# Patient Record
Sex: Female | Born: 1940 | Race: White | Hispanic: No | Marital: Married | Smoking: Never smoker
Health system: Southern US, Community
[De-identification: ages and names within clinical notes are randomized; demographics above are authoritative.]

## PROBLEM LIST (undated history)

## (undated) ENCOUNTER — Encounter: Attending: Diagnostic Radiology | Primary: Diagnostic Radiology

## (undated) ENCOUNTER — Encounter

## (undated) ENCOUNTER — Telehealth

## (undated) ENCOUNTER — Ambulatory Visit: Payer: MEDICARE

## (undated) ENCOUNTER — Telehealth: Attending: Hematology & Oncology | Primary: Hematology & Oncology

## (undated) ENCOUNTER — Encounter: Attending: Hematology & Oncology | Primary: Hematology & Oncology

## (undated) ENCOUNTER — Ambulatory Visit: Payer: MEDICARE | Attending: Hematology & Oncology | Primary: Hematology & Oncology

## (undated) ENCOUNTER — Ambulatory Visit: Payer: BLUE CROSS/BLUE SHIELD

## (undated) ENCOUNTER — Ambulatory Visit

## (undated) ENCOUNTER — Ambulatory Visit: Payer: BLUE CROSS/BLUE SHIELD | Attending: Hematology & Oncology | Primary: Hematology & Oncology

## (undated) DIAGNOSIS — R350 Frequency of micturition: Secondary | ICD-10-CM

## (undated) DIAGNOSIS — I10 Essential (primary) hypertension: Secondary | ICD-10-CM

## (undated) DIAGNOSIS — C50911 Malignant neoplasm of unspecified site of right female breast: Secondary | ICD-10-CM

## (undated) DIAGNOSIS — R011 Cardiac murmur, unspecified: Secondary | ICD-10-CM

## (undated) DIAGNOSIS — M109 Gout, unspecified: Secondary | ICD-10-CM

## (undated) DIAGNOSIS — R0600 Dyspnea, unspecified: Secondary | ICD-10-CM

## (undated) DIAGNOSIS — C50912 Malignant neoplasm of unspecified site of left female breast: Secondary | ICD-10-CM

## (undated) DIAGNOSIS — K219 Gastro-esophageal reflux disease without esophagitis: Secondary | ICD-10-CM

## (undated) DIAGNOSIS — M1990 Unspecified osteoarthritis, unspecified site: Secondary | ICD-10-CM

## (undated) HISTORY — PX: DILATION AND CURETTAGE OF UTERUS: SHX78

## (undated) HISTORY — PX: VAGINAL HYSTERECTOMY: SUR661

---

## 1981-02-16 HISTORY — PX: CYST EXCISION: SHX5701

## 1989-02-16 DIAGNOSIS — C50912 Malignant neoplasm of unspecified site of left female breast: Secondary | ICD-10-CM

## 1989-02-16 HISTORY — PX: BREAST BIOPSY: SHX20

## 1989-02-16 HISTORY — PX: MASTECTOMY: SHX3

## 1989-02-16 HISTORY — DX: Malignant neoplasm of unspecified site of left female breast: C50.912

## 2008-02-17 DIAGNOSIS — C50911 Malignant neoplasm of unspecified site of right female breast: Secondary | ICD-10-CM

## 2008-02-17 HISTORY — PX: BREAST BIOPSY: SHX20

## 2008-02-17 HISTORY — PX: MASTECTOMY: SHX3

## 2008-02-17 HISTORY — DX: Malignant neoplasm of unspecified site of right female breast: C50.911

## 2017-06-03 ENCOUNTER — Ambulatory Visit: Admit: 2017-06-03 | Discharge: 2017-06-03 | Payer: MEDICARE

## 2017-06-03 ENCOUNTER — Encounter: Admit: 2017-06-03 | Discharge: 2017-06-03 | Payer: MEDICARE

## 2017-06-03 DIAGNOSIS — C50211 Malignant neoplasm of upper-inner quadrant of right female breast: Principal | ICD-10-CM

## 2017-11-11 NOTE — Pre-Procedure Instructions (Signed)
Sandra Wise  11/11/2017      [facility] - [place], [place] - [place] [address] [place] [address] Phone: [phone] Fax: [phone]    Your procedure is scheduled on Thursday,September 3.  Report to [facility] Admitting at 8:00 AM               Your surgery or procedure is scheduled for 10:00 A.M.   Call this number if you have problems the morning of surgery: [phone]  This is the number for the Pre- Surgical Desk.     Remember:  Do not eat or drink after midnight Wednesday, October 2.                     Take these medicines the morning of surgery with A SIP OF WATER: allopurinol (ZYLOPRIM)  If needed may take:  Eye Drops acetaminophen (TYLENOL 8 HOUR ARTHRITIS PAIN)  STOP taking Aspirin, Aspirin Products (Goody Powder, Excedrin Migraine), Ibuprofen (Advil), Naproxen (Aleve),diclofenac sodium (VOLTAREN)  Vitamins and Herbal Products (ie Fish Oil)    Do not wear jewelry, make-up or nail polish.  Do not wear lotions, powders, or perfumes, or deodorant.  Do not shave 48 hours prior to surgery.  Men may shave face and neck.  Do not bring valuables to the hospital.  [facility] is not responsible for any belongings or valuables.  Contacts, dentures or bridgework may not be worn into surgery.  Leave your suitcase in the car.  After surgery it may be brought to your room.  For patients admitted to the hospital, discharge time will be determined by your treatment team.  Patients discharged the day of surgery will not be allowed to drive home.   Special instructions:   [facility]- Preparing For Surgery  Before surgery, you can play an important role. Because skin is not sterile, your skin needs to be as free of germs as possible. You can reduce the number of germs on your skin by washing with CHG (chlorahexidine gluconate) Soap before surgery.  CHG is an antiseptic cleaner which kills germs and bonds with the skin to continue killing  germs even after washing.    Oral Hygiene is also important to reduce your risk of infection.  Remember - BRUSH YOUR TEETH THE MORNING OF SURGERY WITH YOUR REGULAR TOOTHPASTE  Please do not use if you have an allergy to CHG or antibacterial soaps. If your skin becomes reddened/irritated stop using the CHG.  Do not shave (including legs and underarms) for at least 48 hours prior to first CHG shower. It is OK to shave your face.  Please follow these instructions carefully.   1. Shower the NIGHT BEFORE SURGERY and the MORNING OF SURGERY with CHG.   2. If you chose to wash your hair, wash your hair first as usual with your normal shampoo.  3. After you shampoo, rinse your hair and body thoroughly to remove the shampoo.  4. Use CHG as you would any other liquid soap. You can apply CHG directly to the skin and wash gently with a scrungie or a clean washcloth.   5. Apply the CHG Soap to your body ONLY FROM THE NECK DOWN.  Do not use on open wounds or open sores. Avoid contact with your eyes, ears, mouth and genitals (private parts). Wash Face and genitals (private parts)  with your normal soap.  6. Wash thoroughly, paying special attention to the area where your  surgery will be performed.  7. Thoroughly rinse your body with warm water from the neck down.  8. DO NOT shower/wash with your normal soap after using and rinsing off the CHG Soap.  9. Pat yourself dry with a CLEAN TOWEL.  10. Wear CLEAN PAJAMAS to bed the night before surgery, wear comfortable clothes the morning of surgery  11. Place CLEAN SHEETS on your bed the night of your first shower and DO NOT SLEEP WITH PETS.    Day of Surgery:  Do not apply any deodorants/lotions.  Please wear clean clothes to the hospital/surgery center.   Remember to brush your teeth WITH YOUR REGULAR TOOTHPASTE.  Please read over the following fact sheets that you were given.

## 2017-11-12 ENCOUNTER — Other Ambulatory Visit: Payer: Self-pay

## 2017-11-12 ENCOUNTER — Encounter (HOSPITAL_COMMUNITY): Payer: Self-pay | Admitting: *Deleted

## 2017-11-12 ENCOUNTER — Encounter (HOSPITAL_COMMUNITY)
Admission: RE | Admit: 2017-11-12 | Discharge: 2017-11-12 | Disposition: A | Discharge status: Home / Self Care | Payer: Medicare Other | Source: Ambulatory Visit | Attending: Orthopedic Surgery | Admitting: Orthopedic Surgery

## 2017-11-12 DIAGNOSIS — Z01818 Encounter for other preprocedural examination: Secondary | ICD-10-CM | POA: Insufficient documentation

## 2017-11-12 HISTORY — DX: Frequency of micturition: R35.0

## 2017-11-12 HISTORY — DX: Essential (primary) hypertension: I10

## 2017-11-12 HISTORY — DX: Dyspnea, unspecified: R06.00

## 2017-11-12 HISTORY — DX: Unspecified osteoarthritis, unspecified site: M19.90

## 2017-11-12 LAB — CBC
HEMATOCRIT: 45 % (ref 36.0–46.0)
HEMOGLOBIN: 13.7 g/dL (ref 12.0–15.0)
MCH: 32.1 pg (ref 26.0–34.0)
MCHC: 30.4 g/dL (ref 30.0–36.0)
MCV: 105.4 fL — ABNORMAL HIGH (ref 78.0–100.0)
Platelets: 181 10*3/uL (ref 150–400)
RBC: 4.27 MIL/uL (ref 3.87–5.11)
RDW: 14 % (ref 11.5–15.5)
WBC: 7.1 10*3/uL (ref 4.0–10.5)

## 2017-11-12 LAB — BASIC METABOLIC PANEL
Anion gap: 15 (ref 5–15)
BUN: 31 mg/dL — AB (ref 8–23)
CHLORIDE: 104 mmol/L (ref 98–111)
CO2: 19 mmol/L — ABNORMAL LOW (ref 22–32)
Calcium: 10 mg/dL (ref 8.9–10.3)
Creatinine, Ser: 1.07 mg/dL — ABNORMAL HIGH (ref 0.44–1.00)
GFR calc Af Amer: 56 mL/min — ABNORMAL LOW (ref >60)
GFR calc non Af Amer: 48 mL/min — ABNORMAL LOW (ref >60)
GLUCOSE: 91 mg/dL (ref 70–99)
POTASSIUM: 4.2 mmol/L (ref 3.5–5.1)
SODIUM: 138 mmol/L (ref 135–145)

## 2017-11-12 LAB — SURGICAL PCR SCREEN
MRSA, PCR: NEGATIVE
Staphylococcus aureus: NEGATIVE

## 2017-11-12 NOTE — Progress Notes (Signed)
PCP Ardelle Balls  [place] ,[place].

## 2017-11-17 MED ORDER — TRANEXAMIC ACID 1000 MG/10ML IV SOLN
1000.0000 mg | INTRAVENOUS | Status: AC
  Administered 2017-11-18: 1000 mg via INTRAVENOUS
  Filled 2017-11-17: qty 1000

## 2017-11-18 ENCOUNTER — Encounter (HOSPITAL_COMMUNITY)
Admission: RE | Disposition: A | Discharge status: Home / Self Care | Payer: Self-pay | Source: Home / Self Care | Attending: Orthopedic Surgery

## 2017-11-18 ENCOUNTER — Other Ambulatory Visit: Payer: Self-pay

## 2017-11-18 ENCOUNTER — Inpatient Hospital Stay (HOSPITAL_COMMUNITY)
Admission: RE | Admit: 2017-11-18 | Discharge: 2017-11-19 | DRG: 483 | Disposition: A | Discharge status: Home / Self Care | Payer: Medicare Other | Attending: Orthopedic Surgery | Admitting: Orthopedic Surgery

## 2017-11-18 ENCOUNTER — Encounter (HOSPITAL_COMMUNITY): Payer: Self-pay

## 2017-11-18 ENCOUNTER — Inpatient Hospital Stay (HOSPITAL_COMMUNITY): Payer: Medicare Other | Admitting: Anesthesiology

## 2017-11-18 DIAGNOSIS — I129 Hypertensive chronic kidney disease with stage 1 through stage 4 chronic kidney disease, or unspecified chronic kidney disease: Secondary | ICD-10-CM | POA: Diagnosis present

## 2017-11-18 DIAGNOSIS — K219 Gastro-esophageal reflux disease without esophagitis: Secondary | ICD-10-CM | POA: Diagnosis present

## 2017-11-18 DIAGNOSIS — Z888 Allergy status to other drugs, medicaments and biological substances status: Secondary | ICD-10-CM | POA: Diagnosis not present

## 2017-11-18 DIAGNOSIS — M19012 Primary osteoarthritis, left shoulder: Principal | ICD-10-CM | POA: Diagnosis present

## 2017-11-18 DIAGNOSIS — Z90710 Acquired absence of both cervix and uterus: Secondary | ICD-10-CM | POA: Diagnosis not present

## 2017-11-18 DIAGNOSIS — M109 Gout, unspecified: Secondary | ICD-10-CM | POA: Diagnosis present

## 2017-11-18 DIAGNOSIS — M75102 Unspecified rotator cuff tear or rupture of left shoulder, not specified as traumatic: Secondary | ICD-10-CM | POA: Diagnosis present

## 2017-11-18 DIAGNOSIS — Z96619 Presence of unspecified artificial shoulder joint: Secondary | ICD-10-CM

## 2017-11-18 DIAGNOSIS — Z853 Personal history of malignant neoplasm of breast: Secondary | ICD-10-CM | POA: Diagnosis not present

## 2017-11-18 DIAGNOSIS — Z79899 Other long term (current) drug therapy: Secondary | ICD-10-CM

## 2017-11-18 DIAGNOSIS — N189 Chronic kidney disease, unspecified: Secondary | ICD-10-CM | POA: Diagnosis present

## 2017-11-18 HISTORY — DX: Gastro-esophageal reflux disease without esophagitis: K21.9

## 2017-11-18 HISTORY — PX: REVERSE SHOULDER ARTHROPLASTY: SHX5054

## 2017-11-18 HISTORY — DX: Gout, unspecified: M10.9

## 2017-11-18 HISTORY — DX: Malignant neoplasm of unspecified site of right female breast: C50.911

## 2017-11-18 HISTORY — DX: Malignant neoplasm of unspecified site of left female breast: C50.912

## 2017-11-18 SURGERY — ARTHROPLASTY, SHOULDER, TOTAL, REVERSE
Anesthesia: General | Site: Shoulder | Laterality: Left

## 2017-11-18 MED ORDER — ONDANSETRON HCL 4 MG/2ML IJ SOLN: 4.0000 mg | Freq: Four times a day (QID) | INTRAMUSCULAR | Status: DC | PRN

## 2017-11-18 MED ORDER — GLYCOPYRROLATE PF 0.2 MG/ML IJ SOSY
PREFILLED_SYRINGE | INTRAMUSCULAR | Status: AC
  Filled 2017-11-18: qty 1

## 2017-11-18 MED ORDER — PROPOFOL 10 MG/ML IV BOLUS
INTRAVENOUS | Status: DC | PRN
  Administered 2017-11-18: 150 mg via INTRAVENOUS

## 2017-11-18 MED ORDER — EPHEDRINE 5 MG/ML INJ
INTRAVENOUS | Status: AC
  Filled 2017-11-18: qty 10

## 2017-11-18 MED ORDER — PHENOL 1.4 % MT LIQD: 1.0000 | OROMUCOSAL | Status: DC | PRN

## 2017-11-18 MED ORDER — MEPERIDINE HCL 50 MG/ML IJ SOLN: 6.2500 mg | INTRAMUSCULAR | Status: DC | PRN

## 2017-11-18 MED ORDER — ALLOPURINOL 100 MG PO TABS
100.0000 mg | ORAL_TABLET | Freq: Every day | ORAL | Status: DC
  Administered 2017-11-19: 100 mg via ORAL
  Filled 2017-11-18: qty 1

## 2017-11-18 MED ORDER — MAGNESIUM CITRATE PO SOLN: 1.0000 | Freq: Once | ORAL | Status: DC | PRN

## 2017-11-18 MED ORDER — PHENYLEPHRINE 40 MCG/ML (10ML) SYRINGE FOR IV PUSH (FOR BLOOD PRESSURE SUPPORT)
PREFILLED_SYRINGE | INTRAVENOUS | Status: DC | PRN
  Administered 2017-11-18: 80 ug via INTRAVENOUS

## 2017-11-18 MED ORDER — OXYCODONE HCL 5 MG PO TABS: 5.0000 mg | ORAL_TABLET | ORAL | Status: DC | PRN

## 2017-11-18 MED ORDER — MENTHOL 3 MG MT LOZG
1.0000 | LOZENGE | OROMUCOSAL | Status: DC | PRN
  Administered 2017-11-18: 3 mg via ORAL
  Filled 2017-11-18 (×2): qty 9

## 2017-11-18 MED ORDER — FENTANYL CITRATE (PF) 100 MCG/2ML IJ SOLN
50.0000 ug | Freq: Once | INTRAMUSCULAR | Status: AC
  Administered 2017-11-18: 50 ug via INTRAVENOUS

## 2017-11-18 MED ORDER — ACETAMINOPHEN 325 MG PO TABS
325.0000 mg | ORAL_TABLET | Freq: Four times a day (QID) | ORAL | Status: DC | PRN
  Administered 2017-11-19 (×2): 650 mg via ORAL
  Filled 2017-11-18 (×2): qty 2

## 2017-11-18 MED ORDER — FENTANYL CITRATE (PF) 100 MCG/2ML IJ SOLN
INTRAMUSCULAR | Status: AC
  Administered 2017-11-18: 50 ug via INTRAVENOUS
  Filled 2017-11-18: qty 2

## 2017-11-18 MED ORDER — FENTANYL CITRATE (PF) 250 MCG/5ML IJ SOLN
INTRAMUSCULAR | Status: AC
  Filled 2017-11-18: qty 5

## 2017-11-18 MED ORDER — PHENYLEPHRINE 40 MCG/ML (10ML) SYRINGE FOR IV PUSH (FOR BLOOD PRESSURE SUPPORT)
PREFILLED_SYRINGE | INTRAVENOUS | Status: AC
  Filled 2017-11-18: qty 10

## 2017-11-18 MED ORDER — CEFAZOLIN SODIUM-DEXTROSE 2-4 GM/100ML-% IV SOLN
2.0000 g | INTRAVENOUS | Status: AC
  Administered 2017-11-18: 2 g via INTRAVENOUS

## 2017-11-18 MED ORDER — METOCLOPRAMIDE HCL 5 MG/ML IJ SOLN: 5.0000 mg | Freq: Three times a day (TID) | INTRAMUSCULAR | Status: DC | PRN

## 2017-11-18 MED ORDER — HYDROMORPHONE HCL 1 MG/ML IJ SOLN: 0.5000 mg | INTRAMUSCULAR | Status: DC | PRN

## 2017-11-18 MED ORDER — LACTATED RINGERS IV SOLN
INTRAVENOUS | Status: DC
  Administered 2017-11-18: 09:00:00 via INTRAVENOUS

## 2017-11-18 MED ORDER — LATANOPROST 0.005 % OP SOLN
1.0000 [drp] | Freq: Every day | OPHTHALMIC | Status: DC
  Administered 2017-11-18: 1 [drp] via OPHTHALMIC
  Filled 2017-11-18: qty 2.5

## 2017-11-18 MED ORDER — ROCURONIUM BROMIDE 50 MG/5ML IV SOSY
PREFILLED_SYRINGE | INTRAVENOUS | Status: AC
  Filled 2017-11-18: qty 5

## 2017-11-18 MED ORDER — BUPIVACAINE LIPOSOME 1.3 % IJ SUSP
INTRAMUSCULAR | Status: DC | PRN
  Administered 2017-11-18 (×2): 3 mL via PERINEURAL
  Administered 2017-11-18: 4 mL via PERINEURAL

## 2017-11-18 MED ORDER — METHOCARBAMOL 1000 MG/10ML IJ SOLN
500.0000 mg | Freq: Four times a day (QID) | INTRAVENOUS | Status: DC | PRN
  Filled 2017-11-18: qty 5

## 2017-11-18 MED ORDER — MIDAZOLAM HCL 2 MG/2ML IJ SOLN
INTRAMUSCULAR | Status: AC
  Administered 2017-11-18: 1 mg via INTRAVENOUS
  Filled 2017-11-18: qty 2

## 2017-11-18 MED ORDER — POLYETHYLENE GLYCOL 3350 17 G PO PACK: 17.0000 g | PACK | Freq: Every day | ORAL | Status: DC | PRN

## 2017-11-18 MED ORDER — DIPHENHYDRAMINE HCL 12.5 MG/5ML PO ELIX: 12.5000 mg | ORAL_SOLUTION | ORAL | Status: DC | PRN

## 2017-11-18 MED ORDER — LACTATED RINGERS IV SOLN
INTRAVENOUS | Status: DC
  Administered 2017-11-18: 16:00:00 via INTRAVENOUS

## 2017-11-18 MED ORDER — KETOROLAC TROMETHAMINE 15 MG/ML IJ SOLN: 15.0000 mg | Freq: Once | INTRAMUSCULAR | Status: DC

## 2017-11-18 MED ORDER — LIDOCAINE 2% (20 MG/ML) 5 ML SYRINGE
INTRAMUSCULAR | Status: DC | PRN
  Administered 2017-11-18: 100 mg via INTRAVENOUS

## 2017-11-18 MED ORDER — DEXAMETHASONE SODIUM PHOSPHATE 10 MG/ML IJ SOLN
INTRAMUSCULAR | Status: AC
  Filled 2017-11-18: qty 1

## 2017-11-18 MED ORDER — ROCURONIUM BROMIDE 10 MG/ML (PF) SYRINGE
PREFILLED_SYRINGE | INTRAVENOUS | Status: DC | PRN
  Administered 2017-11-18: 50 mg via INTRAVENOUS
  Administered 2017-11-18: 10 mg via INTRAVENOUS

## 2017-11-18 MED ORDER — MIDAZOLAM HCL 2 MG/2ML IJ SOLN
1.0000 mg | Freq: Once | INTRAMUSCULAR | Status: AC
  Administered 2017-11-18: 1 mg via INTRAVENOUS

## 2017-11-18 MED ORDER — FENTANYL CITRATE (PF) 250 MCG/5ML IJ SOLN
INTRAMUSCULAR | Status: DC | PRN
  Administered 2017-11-18: 50 ug via INTRAVENOUS

## 2017-11-18 MED ORDER — METOCLOPRAMIDE HCL 5 MG PO TABS: 5.0000 mg | ORAL_TABLET | Freq: Three times a day (TID) | ORAL | Status: DC | PRN

## 2017-11-18 MED ORDER — ALUM & MAG HYDROXIDE-SIMETH 200-200-20 MG/5ML PO SUSP: 30.0000 mL | ORAL | Status: DC | PRN

## 2017-11-18 MED ORDER — PROMETHAZINE HCL 25 MG/ML IJ SOLN: 6.2500 mg | INTRAMUSCULAR | Status: DC | PRN

## 2017-11-18 MED ORDER — TEMAZEPAM 15 MG PO CAPS: 15.0000 mg | ORAL_CAPSULE | Freq: Every evening | ORAL | Status: DC | PRN

## 2017-11-18 MED ORDER — ONDANSETRON HCL 4 MG/2ML IJ SOLN
INTRAMUSCULAR | Status: AC
  Filled 2017-11-18: qty 2

## 2017-11-18 MED ORDER — CHLORHEXIDINE GLUCONATE 4 % EX LIQD: 60.0000 mL | Freq: Once | CUTANEOUS | Status: DC

## 2017-11-18 MED ORDER — HYDROMORPHONE HCL 1 MG/ML IJ SOLN: 0.2500 mg | INTRAMUSCULAR | Status: DC | PRN

## 2017-11-18 MED ORDER — ONDANSETRON HCL 4 MG/2ML IJ SOLN
INTRAMUSCULAR | Status: DC | PRN
  Administered 2017-11-18: 4 mg via INTRAVENOUS

## 2017-11-18 MED ORDER — 0.9 % SODIUM CHLORIDE (POUR BTL) OPTIME
TOPICAL | Status: DC | PRN
  Administered 2017-11-18: 1000 mL

## 2017-11-18 MED ORDER — METHOCARBAMOL 500 MG PO TABS: 500.0000 mg | ORAL_TABLET | Freq: Four times a day (QID) | ORAL | Status: DC | PRN

## 2017-11-18 MED ORDER — ONDANSETRON HCL 4 MG PO TABS: 4.0000 mg | ORAL_TABLET | Freq: Four times a day (QID) | ORAL | Status: DC | PRN

## 2017-11-18 MED ORDER — DOCUSATE SODIUM 100 MG PO CAPS
100.0000 mg | ORAL_CAPSULE | Freq: Two times a day (BID) | ORAL | Status: DC
  Administered 2017-11-18 – 2017-11-19 (×2): 100 mg via ORAL
  Filled 2017-11-18 (×2): qty 1

## 2017-11-18 MED ORDER — SODIUM CHLORIDE 0.9 % IV SOLN
INTRAVENOUS | Status: DC | PRN
  Administered 2017-11-18: 10 ug/min via INTRAVENOUS

## 2017-11-18 MED ORDER — SUGAMMADEX SODIUM 200 MG/2ML IV SOLN
INTRAVENOUS | Status: DC | PRN
  Administered 2017-11-18: 200 mg via INTRAVENOUS

## 2017-11-18 MED ORDER — DOXEPIN HCL 10 MG PO CAPS
10.0000 mg | ORAL_CAPSULE | Freq: Every day | ORAL | Status: DC
  Administered 2017-11-18: 10 mg via ORAL
  Filled 2017-11-18: qty 1

## 2017-11-18 MED ORDER — LISINOPRIL 20 MG PO TABS
20.0000 mg | ORAL_TABLET | Freq: Every day | ORAL | Status: DC
  Administered 2017-11-18 – 2017-11-19 (×2): 20 mg via ORAL
  Filled 2017-11-18 (×2): qty 1

## 2017-11-18 MED ORDER — BISACODYL 5 MG PO TBEC: 5.0000 mg | DELAYED_RELEASE_TABLET | Freq: Every day | ORAL | Status: DC | PRN

## 2017-11-18 MED ORDER — BUPIVACAINE-EPINEPHRINE (PF) 0.5% -1:200000 IJ SOLN
INTRAMUSCULAR | Status: DC | PRN
  Administered 2017-11-18 (×3): 5 mL via PERINEURAL

## 2017-11-18 SURGICAL SUPPLY — 57 items
BASEPLATE GLENOID SHLDR SM (Shoulder) ×2 IMPLANT
BLADE SAW SGTL 83.5X18.5 (BLADE) ×2 IMPLANT
COVER SURGICAL LIGHT HANDLE (MISCELLANEOUS) ×2 IMPLANT
COVER WAND RF STERILE (DRAPES) ×2 IMPLANT
CUP SUT UNIV REVERS 36 NEUTRAL (Cup) ×2 IMPLANT
DERMABOND ADHESIVE PROPEN (GAUZE/BANDAGES/DRESSINGS) ×1
DERMABOND ADVANCED (GAUZE/BANDAGES/DRESSINGS) ×1
DERMABOND ADVANCED .7 DNX12 (GAUZE/BANDAGES/DRESSINGS) ×1 IMPLANT
DERMABOND ADVANCED .7 DNX6 (GAUZE/BANDAGES/DRESSINGS) ×1 IMPLANT
DRAPE ORTHO SPLIT 77X108 STRL (DRAPES) ×2
DRAPE SURG 17X11 SM STRL (DRAPES) ×2 IMPLANT
DRAPE SURG ORHT 6 SPLT 77X108 (DRAPES) ×2 IMPLANT
DRAPE U-SHAPE 47X51 STRL (DRAPES) ×2 IMPLANT
DRSG AQUACEL AG ADV 3.5X10 (GAUZE/BANDAGES/DRESSINGS) ×2 IMPLANT
DURAPREP 26ML APPLICATOR (WOUND CARE) ×2 IMPLANT
ELECT BLADE 4.0 EZ CLEAN MEGAD (MISCELLANEOUS) ×2
ELECT CAUTERY BLADE 6.4 (BLADE) ×2 IMPLANT
ELECT REM PT RETURN 9FT ADLT (ELECTROSURGICAL) ×2
ELECTRODE BLDE 4.0 EZ CLN MEGD (MISCELLANEOUS) ×1 IMPLANT
ELECTRODE REM PT RTRN 9FT ADLT (ELECTROSURGICAL) ×1 IMPLANT
FACESHIELD WRAPAROUND (MASK) ×6 IMPLANT
GLENOSPHERE LATERAL 36MM+4 (Shoulder) ×2 IMPLANT
GLOVE BIO SURGEON STRL SZ7.5 (GLOVE) ×2 IMPLANT
GLOVE BIO SURGEON STRL SZ8 (GLOVE) ×2 IMPLANT
GLOVE EUDERMIC 7 POWDERFREE (GLOVE) ×2 IMPLANT
GLOVE SS BIOGEL STRL SZ 7.5 (GLOVE) ×1 IMPLANT
GLOVE SUPERSENSE BIOGEL SZ 7.5 (GLOVE) ×1
GOWN STRL REUS W/ TWL LRG LVL3 (GOWN DISPOSABLE) ×1 IMPLANT
GOWN STRL REUS W/ TWL XL LVL3 (GOWN DISPOSABLE) ×2 IMPLANT
GOWN STRL REUS W/TWL LRG LVL3 (GOWN DISPOSABLE) ×1
GOWN STRL REUS W/TWL XL LVL3 (GOWN DISPOSABLE) ×2
KIT BASIN OR (CUSTOM PROCEDURE TRAY) ×2 IMPLANT
KIT TURNOVER KIT B (KITS) ×2 IMPLANT
LINER HUMERAL 36 +3MM SM (Shoulder) ×2 IMPLANT
MANIFOLD NEPTUNE II (INSTRUMENTS) ×2 IMPLANT
NEEDLE TAPERED W/ NITINOL LOOP (MISCELLANEOUS) ×2 IMPLANT
NS IRRIG 1000ML POUR BTL (IV SOLUTION) ×2 IMPLANT
PACK SHOULDER (CUSTOM PROCEDURE TRAY) ×2 IMPLANT
PAD ARMBOARD 7.5X6 YLW CONV (MISCELLANEOUS) ×4 IMPLANT
PIN SET MODULAR GLENOID SYSTEM (PIN) ×2 IMPLANT
RESTRAINT HEAD UNIVERSAL NS (MISCELLANEOUS) ×2 IMPLANT
SCREW CENTRAL NONLOCK 6.5X20MM (Shoulder) ×2 IMPLANT
SCREW LOCK GLENOID UNI PERI (Screw) ×2 IMPLANT
SCREW LOCK PERIPHERAL 30MM (Shoulder) ×2 IMPLANT
SLING ARM FOAM STRAP LRG (SOFTGOODS) IMPLANT
SLING ARM IMMOBILIZER XL (CAST SUPPLIES) ×2 IMPLANT
SPONGE LAP 18X18 X RAY DECT (DISPOSABLE) ×2 IMPLANT
SPONGE LAP 4X18 RFD (DISPOSABLE) ×2 IMPLANT
STEM HUMERAL MOD SZ 5 135 DEG (Stem) ×2 IMPLANT
SUT MNCRL AB 3-0 PS2 18 (SUTURE) ×2 IMPLANT
SUT MON AB 2-0 CT1 27 (SUTURE) ×2 IMPLANT
SUT VIC AB 1 CT1 27 (SUTURE) ×1
SUT VIC AB 1 CT1 27XBRD ANBCTR (SUTURE) ×1 IMPLANT
SUTURE TAPE 1.3 40 TPR END (SUTURE) ×1 IMPLANT
SUTURETAPE 1.3 40 TPR END (SUTURE) ×2
TOWEL OR 17X26 10 PK STRL BLUE (TOWEL DISPOSABLE) ×2 IMPLANT
WATER STERILE IRR 1000ML POUR (IV SOLUTION) ×2 IMPLANT

## 2017-11-18 NOTE — Anesthesia Preprocedure Evaluation (Signed)
Anesthesia Evaluation  Patient identified by MRN, date of birth, ID band Patient awake    Reviewed: Allergy & Precautions, NPO status , Patient's Chart, lab work & pertinent test results  Airway Mallampati: I       Dental no notable dental hx. (+) Teeth Intact   Pulmonary    Pulmonary exam normal breath sounds clear to auscultation       Cardiovascular hypertension, Pt. on medications Normal cardiovascular exam Rhythm:Regular Rate:Normal     Neuro/Psych negative neurological ROS  negative psych ROS   GI/Hepatic negative GI ROS, Neg liver ROS,   Endo/Other  negative endocrine ROS  Renal/GU   negative genitourinary   Musculoskeletal   Abdominal (+) + obese,   Peds  Hematology   Anesthesia Other Findings   Reproductive/Obstetrics                             Anesthesia Physical Anesthesia Plan  ASA: II  Anesthesia Plan: General   Post-op Pain Management:  Regional for Post-op pain   Induction: Intravenous  PONV Risk Score and Plan: 3 and Ondansetron and Treatment may vary due to age or medical condition  Airway Management Planned: Oral ETT  Additional Equipment:   Intra-op Plan:   Post-operative Plan: Extubation in OR  Informed Consent: I have reviewed the patients History and Physical, chart, labs and discussed the procedure including the risks, benefits and alternatives for the proposed anesthesia with the patient or authorized representative who has indicated his/her understanding and acceptance.   Dental advisory given  Plan Discussed with: CRNA and Surgeon  Anesthesia Plan Comments:         Anesthesia Quick Evaluation

## 2017-11-18 NOTE — Discharge Instructions (Signed)
° °Kevin M. Supple, M.D., F.A.A.O.S. °Orthopaedic Surgery °Specializing in Arthroscopic and Reconstructive °Surgery of the Shoulder and Knee °[phone] °[address] - [place], [place] [address] - Fax [phone] ° ° °POST-OP TOTAL SHOULDER REPLACEMENT INSTRUCTIONS ° °1. Call the office at [phone] to schedule your first post-op appointment 10-14 days from the date of your surgery. ° °2. The bandage over your incision is waterproof. You may begin showering with this dressing on. You may leave this dressing on until first follow up appointment within 2 weeks. We prefer you leave this dressing in place until follow up however after 5-7 days if you are having itching or skin irritation and would like to remove it you may do so. Go slow and tug at the borders gently to break the bond the dressing has with the skin. At this point if there is no drainage it is okay to go without a bandage or you may cover it with a light guaze and tape. You can also expect significant bruising around your shoulder that will drift down your arm and into your chest wall. This is very normal and should resolve over several days. ° ° 3. Wear your sling/immobilizer at all times except to perform the exercises below or to occasionally let your arm dangle by your side to stretch your elbow. You also need to sleep in your sling immobilizer until instructed otherwise. ° °4. Range of motion to your elbow, wrist, and hand are encouraged 3-5 times daily. Exercise to your hand and fingers helps to reduce swelling you may experience. ° °5. Utilize ice to the shoulder 3-5 times minimum a day and additionally if you are experiencing pain. ° °6. Prescriptions for a pain medication and a muscle relaxant are provided for you. It is recommended that if you are experiencing pain that you pain medication alone is not controlling, add the muscle relaxant along with the pain medication which can give additional pain relief. The first 1-2 days  is generally the most severe of your pain and then should gradually decrease. As your pain lessens it is recommended that you decrease your use of the pain medications to an "as needed basis'" only and to always comply with the recommended dosages of the pain medications. ° °7. Pain medications can produce constipation along with their use. If you experience this, the use of an over the counter stool softener or laxative daily is recommended.  ° °8. For additional questions or concerns, please do not hesitate to call the office. If after hours there is an answering service to forward your concerns to the physician on call. ° °9.Pain control following an exparel block ° °To help control your post-operative pain you received a nerve block  performed with Exparel which is a long acting anesthetic (numbing agent) which can provide pain relief and sensations of numbness (and relief of pain) in the operative shoulder and arm for up to 3 days. Sometimes it provides mixed relief, meaning you may still have numbness in certain areas of the arm but can still be able to move  parts of that arm, hand, and fingers. We recommend that your prescribed pain medications  be used as needed. We do not feel it is necessary to "pre medicate" and "stay ahead" of pain.  Taking narcotic pain medications when you are not having any pain can lead to unnecessary and potentially dangerous side effects.  ° °POST-OP EXERCISES ° °Pendulum Exercises ° °Perform pendulum exercises while standing and bending at   the waist. Support your uninvolved arm on a table or chair and allow your operated arm to hang freely. Make sure to do these exercises passively - not using you shoulder muscles. ° °Repeat 20 times. Do 3 sessions per day. ° ° ° ° °

## 2017-11-18 NOTE — Anesthesia Procedure Notes (Signed)
Anesthesia Regional Block: Interscalene brachial plexus block   Pre-Anesthetic Checklist: ,, timeout performed, Correct Patient, Correct Site, Correct Laterality, Correct Procedure, Correct Position, site marked, Risks and benefits discussed,  Surgical consent,  Pre-op evaluation,  At surgeon's request and post-op pain management  Laterality: Left and Upper  Prep: chloraprep       Needles:  Injection technique: Single-shot  Needle Type: Echogenic Stimulator Needle     Needle Length: 10cm  Needle Gauge: 21   Needle insertion depth: 2 cm   Additional Needles:   Procedures:,,,, ultrasound used (permanent image in chart),,,,  Narrative:  Start time: 11/18/2017 9:27 AM End time: 11/18/2017 9:37 AM Injection made incrementally with aspirations every 5 mL.  Performed by: Personally  Anesthesiologist: Lyn Hollingshead, MD

## 2017-11-18 NOTE — Transfer of Care (Signed)
Immediate Anesthesia Transfer of Care Note  Patient: Sandra Wise  Procedure(s) Performed: LEFT REVERSE SHOULDER ARTHROPLASTY (Left Shoulder)  Patient Location: PACU  Anesthesia Type:GA combined with regional for post-op pain  Level of Consciousness: awake, alert  and oriented  Airway & Oxygen Therapy: Patient Spontanous Breathing  Post-op Assessment: Report given to RN and Post -op Vital signs reviewed and stable  Post vital signs: Reviewed and stable  Last Vitals:  Vitals Value Taken Time  BP 132/70 11/18/2017 12:23 PM  Temp    Pulse 77 11/18/2017 12:26 PM  Resp 13 11/18/2017 12:26 PM  SpO2 100 % 11/18/2017 12:26 PM  Vitals shown include unvalidated device data.  Last Pain:  Vitals:   11/18/17 0832  TempSrc:   PainSc: 6       Patients Stated Pain Goal: 2 (58/25/18 9842)  Complications: No apparent anesthesia complications

## 2017-11-18 NOTE — Anesthesia Procedure Notes (Signed)
Procedure Name: Intubation Date/Time: 11/18/2017 10:24 AM Performed by: Marsa Aris, CRNA Pre-anesthesia Checklist: Patient identified, Emergency Drugs available, Suction available and Patient being monitored Patient Re-evaluated:Patient Re-evaluated prior to induction Oxygen Delivery Method: Circle System Utilized Preoxygenation: Pre-oxygenation with 100% oxygen Induction Type: IV induction Ventilation: Mask ventilation without difficulty Laryngoscope Size: Glidescope and 4 Grade View: Grade I Tube type: Oral Tube size: 7.0 mm Number of attempts: 1 Airway Equipment and Method: Stylet and Oral airway Placement Confirmation: ETT inserted through vocal cords under direct vision,  positive ETCO2 and breath sounds checked- equal and bilateral Secured at: 22 cm Tube secured with: Tape Dental Injury: Teeth and Oropharynx as per pre-operative assessment  Difficulty Due To: Difficulty was anticipated and Difficult Airway- due to large tongue Comments: No change in dentition from pre-procedure.

## 2017-11-18 NOTE — H&P (Signed)
Sandra Wise    Chief Complaint: left shoulder advanced arthritis HPI: The patient is a 77 y.o. 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 Arthritis   . Cancer [facility])    Breast Cancer  1991 and 2010  . Chronic kidney disease   . Dyspnea    with exertion  . Frequency of urination   . Hypertension     Past Surgical History:  Procedure Laterality Date  . ABDOMINAL HYSTERECTOMY    . cyst on hip bone Left 1983    History reviewed. No pertinent family history.  Social History:  reports that she has never smoked. She has never used smokeless tobacco. She reports that she does not drink alcohol or use drugs.   Medications Prior to Admission  Medication Sig Dispense Refill  . acetaminophen (TYLENOL 8 HOUR ARTHRITIS PAIN) 650 MG CR tablet Take 650 mg by mouth every 8 (eight) hours as needed for pain.    Marland Kitchen allopurinol (ZYLOPRIM) 100 MG tablet Take 100 mg by mouth daily.    . diclofenac sodium (VOLTAREN) 1 % GEL Apply 2-4 g topically 4 (four) times daily as needed (FOR SHOULDER, KNEES ARTHRITIS PAIN.).    Marland Kitchen doxepin (SINEQUAN) 10 MG capsule Take 10 mg by mouth at bedtime.    Marland Kitchen latanoprost (XALATAN) 0.005 % ophthalmic solution Place 1 drop into both eyes at bedtime.    Marland Kitchen lisinopril (PRINIVIL,ZESTRIL) 20 MG tablet Take 20 mg by mouth daily.    . Liver Extract (LIVER PO) Take 1 capsule by mouth daily. [place]    . Multiple Vitamin (MULTIVITAMIN WITH MINERALS) TABS tablet Take 1 tablet by mouth daily.    . NON FORMULARY Take 2 capsules by mouth 2 (two) times daily. CATAPLEX G    . NON FORMULARY Take 1 capsule by mouth at bedtime. Primal Force Ultra Accel II with BioPQQ & Tocotrienols    . Omega-3 Fatty Acids (OMEGA 3 PO) Take 1 capsule by mouth daily. Omega Rejuvenol    . OVER THE COUNTER MEDICATION Take 1 capsule by mouth 2 (two) times daily. InControl 24 (Bladder Support Supplement)    . OVER THE COUNTER MEDICATION  Take 1 capsule by mouth 2 (two) times daily. Cardio-Plus    . Polyethyl Glycol-Propyl Glycol (SYSTANE) 0.4-0.3 % SOLN Place 1 drop into both eyes 3 (three) times daily as needed (FOR DRY/IRRITATED EYES).    . Probiotic Product (PROBIOTIC PO) Take 1 capsule by mouth 2 (two) times daily. ProSynbiotic    . TURMERIC PO Take 2 capsules by mouth 2 (two) times daily. Tumeric Forte    . UNABLE TO FIND Take 2 capsules by mouth 2 (two) times daily. Cataplex F    . vitamin C (ASCORBIC ACID) 500 MG tablet Take 500 mg by mouth daily.    . Zinc 50 MG TABS Take 50 mg by mouth daily.       Physical Exam:Left shoulder with severely painful and restricted motion as noted at recent office visits  Vitals  Temp:  [98.3 F (36.8 C)] 98.3 F (36.8 C) (10/03 0808) Pulse Rate:  [91] 91 (10/03 0808) Resp:  [18] 18 (10/03 0808) BP: (177)/(79) 177/79 (10/03 0808) SpO2:  [98 %] 98 % (10/03 0808) Weight:  [97.8 kg] 97.8 kg (10/03 0808)  Assessment/Plan  Impression: left shoulder advanced arthritis  Plan of Action: Procedure(s): LEFT REVERSE SHOULDER ARTHROPLASTY  Sandra Wise M Juni Glaab 11/18/2017, 9:33 AM Contact # [phone]

## 2017-11-18 NOTE — Anesthesia Postprocedure Evaluation (Signed)
Anesthesia Post Note  Patient: Sandra Wise  Procedure(s) Performed: LEFT REVERSE SHOULDER ARTHROPLASTY (Left Shoulder)     Patient location during evaluation: PACU Anesthesia Type: General Level of consciousness: sedated Pain management: pain level controlled Vital Signs Assessment: post-procedure vital signs reviewed and stable Respiratory status: spontaneous breathing Cardiovascular status: stable Postop Assessment: no apparent nausea or vomiting Anesthetic complications: no    Last Vitals:  Vitals:   11/18/17 1308 11/18/17 1315  BP: 128/67   Pulse:  76  Resp: 13 16  Temp:    SpO2:  97%    Last Pain:  Vitals:   11/18/17 1315  TempSrc:   PainSc: 0-No pain   Pain Goal: Patients Stated Pain Goal: 2 (11/18/17 0832)               Korynn Kenedy JR,[name] Mateo Flow

## 2017-11-18 NOTE — Op Note (Signed)
11/18/2017  12:13 PM  PATIENT:   Arcola Jansky  77 y.o. female  PRE-OPERATIVE DIAGNOSIS:  left shoulder advanced arthritis with associated severe rotator cuff dysfunction   pOST-OPERATIVE DIAGNOSIS: Same  PROCEDURE: Left shoulder reverse arthroplasty utilizing a press-fit size 5.5 Arthrex stem with a +3 polyethylene insert, and a 36/+4 glenosphere on a small baseplate  SURGEON:  Kjersti Dittmer, Metta Clines M.D.  ASSISTANTS: Jenetta Loges, PA-C  ANESTHESIA:   General endotracheal as well as interscalene block with Exparel  EBL: 150 cc  SPECIMEN: None  Drains: None   PATIENT DISPOSITION:  PACU - hemodynamically stable.    PLAN OF CARE: Admit for overnight observation  Brief history:  Ms. Genevive Bi has been followed for chronic and progressively increasing left shoulder pain related to end-stage osteoarthritis with associated profound rotator cuff dysfunction.  Her clinical exam demonstrates severe restrictions in mobility.  Due to her increasing pain and functional mentation she is brought to the operating this time for planned left shoulder reverse arthroplasty  Preoperatively and counseled Ms. Oaks regarding treatment options as well as the potential risks versus benefits throughout.  Possible surgical complications were reviewed including the potential for bleeding, infection, neurovascular injury, persistent pain, loss of motion, anesthetic complication, failure of the implant, and possible need for additional surgery.  She understands and accepts and agrees with her planned procedure.  Procedure detail:  After undergoing routine preop evaluation patient received prophylactic antibiotics and interscalene block with Exparel was established in the holding area by the anesthesia department.  Placed supine on the table underwent smooth induction of a general endotracheal anesthesia using a glide scope.  Patient was then placed in the beachchair position and appropriate padding protected.  We did  have to establish an IV in the left foot and then carefully protected the IV catheter that we maintained in the left forearm this was appropriately wrapped and padded and then we went ahead and placed the patient the beachchair position and sterilely prepped and draped the left shoulder girdle region using standard technique.  Timeout was called.  Anterior deltopectoral approach left shoulder was made through 10 cm incision.  Skin flaps were elevated dissection carried deeply deltopectoral interval and developed a proximal distal with a vein taken laterally upper centimeter the pectoralis major was tenotomized to improve exposure and the biceps tendon was then tenodesed at the upper border of the pectoralis major tendon.  The conjoined tendon retracted medially the long head bicep was then unroofed proximally and excised and then the subscapularis was dissected from the lesser tuberosity insertion using electrocautery the free margin tagged with a pair of suture tape sutures.  The capsular attachments from the anterior inferior and inferior aspects of the humeral neck within divided subperiosteal fashion to deliver the humeral head to the wound and then we outlined our proposed humeral head resection using an extra medullary guide was then resected with an oscillating saw and the peripheral osteophytes were removed with rondure.  A metal cap was then placed over the cut proximal humeral surface and we then exposed the glenoid with combination of Fukuda, pitchfork, inspecting retractors.  I performed a circumferential labral resection gaining complete visualization of the periphery of the glenoid.  A guidepin was then directed into the center of the glenoid with an approximate 10 degree inferior tilt and repair of the glenoid reaming with the central and peripheral reamers to a stable subchondral bony bed.  Our small baseplate was then impacted into position and transfixed with a central  lag screw followed by the  inferior and superior locking screws all of which obtained excellent purchase and fixation.  We then completed preparation of the baseplate and impacted a 36+4 glenosphere.  Debility was then confirmed.  We then returned our attention back to the proximal humerus with a Metal cap was removed.  Hand reaming the canal up to size 6 broaching up to size 5.5 maintain the native retroversion of 20 degrees.  The metaphysis was then reamed with a central reamer.  We then placed a trial implant and this showed good soft tissue balance.  The trial was then removed our final implant was assembled on the back table and then was impacted into the humerus with excellent interference fit and fixation.  Trial reduction was then completed in the +3 poly-showed [place] the best soft tissue balance.  The final +3 polyethylene insert was then impacted in place the wound was irrigated final reduction was performed.  We are very pleased with the overall stability and soft tissue balance and shoulder motion.  The subscapularis was then repaired back to the metaphysis of the humerus using the eyelets on the collar of our humeral implant.  This easily allow 45 degrees of X rotation without excessive tension on the subscap repair.  The deltopectoral interval was then reapproximated with a series of figure-of-eight #1 Vicryl sutures.  2-0 Vicryl used with subcu layer intracuticular 3-0 Monocryl for the skin followed by Dermabond and Aquasol dressing left arm was then placed into a sling and the patient was awakened, extubated, taken recovery in stable condition  [facility], PA-C was used as an Environmental consultant throughout this case essential for help with positioning the patient, position extremity, tissue ablation, implantation the prosthesis, wound closure, and intraoperative decision-making.  Marin Shutter MD   Contact # [phone]

## 2017-11-19 ENCOUNTER — Encounter (HOSPITAL_COMMUNITY): Payer: Self-pay | Admitting: Orthopedic Surgery

## 2017-11-19 MED ORDER — ONDANSETRON HCL 4 MG PO TABS: 4.0000 mg | ORAL_TABLET | Freq: Three times a day (TID) | ORAL | 0 refills | Status: DC | PRN

## 2017-11-19 MED ORDER — TRAMADOL HCL 50 MG PO TABS: 50.0000 mg | ORAL_TABLET | Freq: Four times a day (QID) | ORAL | 0 refills | Status: DC | PRN

## 2017-11-19 NOTE — Progress Notes (Signed)
Occupational Therapy Treatment Patient Details Name: Sandra Wise MRN: [id number] DOB: [birth year] Today's Date: 11/19/2017    History of present illness s/p LEFT REVERSE SHOULDER ARTHROPLASTY.  PMH includes breast cancer, HTN, gout, dyspnea.   OT comments  Pt seen for second session to address pendulum and lap slide exercises. Pt completed these in seated position. Spouse present and included in education. Pt ok for d/c home from OT standpoint.    Follow Up Recommendations  Follow surgeon's recommendation for DC plan and follow-up therapies;Supervision/Assistance - 24 hour    Equipment Recommendations  3 in 1 bedside commode    Recommendations for Other Services      Precautions / Restrictions Precautions Precautions: Shoulder;Fall Shoulder Interventions: Shoulder sling/immobilizer;At all times;Off for dressing/bathing/exercises(off in controlled sitting ) Precaution Booklet Issued: Yes (comment) Required Braces or Orthoses: Sling Restrictions Weight Bearing Restrictions: Yes LUE Weight Bearing: Non weight bearing       Mobility Bed Mobility Overal bed mobility: Needs Assistance Bed Mobility: Supine to Sit     Supine to sit: Min assist;HOB elevated     General bed mobility comments: up in chair  Transfers Overall transfer level: Needs assistance Equipment used: Straight cane Transfers: Sit to/from Stand Sit to Stand: Min guard         General transfer comment: close min guard for safety. from EOB, comfort height toilet, and recliner    Balance Overall balance assessment: Needs assistance Sitting-balance support: Single extremity supported;No upper extremity supported;Feet supported Sitting balance-Leahy Scale: Good     Standing balance support: Single extremity supported Standing balance-Leahy Scale: Poor Standing balance comment: external support needed                           ADL either performed or assessed with clinical judgement   ADL  Overall ADL's : Needs assistance/impaired Eating/Feeding: Set up;Sitting   Grooming: Moderate assistance;Sitting   Upper Body Bathing: Sitting;Moderate assistance   Lower Body Bathing: Sit to/from stand;Moderate assistance   Upper Body Dressing : Sitting;Moderate assistance   Lower Body Dressing: Sit to/from stand;Moderate assistance   Toilet Transfer: Min guard;Ambulation;Comfort height toilet;Grab bars(cane )   Toileting- Clothing Manipulation and Hygiene: Moderate assistance;Sit to/from stand   Tub/ Shower Transfer: Walk-in shower;Min guard;Minimal assistance;Ambulation;3 in 1(cane)   Functional mobility during ADLs: Min guard;Cane General ADL Comments: Reviewed ADL education briefly     Vision       Perception     Praxis      Cognition Arousal/Alertness: Awake/alert Behavior During Therapy: WFL for tasks assessed/performed Overall Cognitive Status: Within Functional Limits for tasks assessed                                          Exercises Exercises: Shoulder Shoulder Exercises Pendulum Exercise: Left;AAROM;Seated;10 reps Other Exercises Other Exercises: lap slides 10 reps, seated   Shoulder Instructions       General Comments      Pertinent Vitals/ Pain       Pain Assessment: 0-10 Pain Score: 5  Faces Pain Scale: Hurts a little bit Pain Location: L shoulder Pain Descriptors / Indicators: Sore Pain Intervention(s): Limited activity within patient's tolerance;Monitored during session;Repositioned  Home Living Family/patient expects to be discharged to:: Private residence Living Arrangements: Spouse/significant other Available Help at Discharge: Family;Available 24 hours/day Type of Home: House Home Access: Ramped entrance  Home Layout: One level     Bathroom Shower/Tub: Walk-in shower         Home Equipment: Crutches;Hand held shower head          Prior Functioning/Environment Level of Independence: Independent  with assistive device(s)            Frequency  Min 2X/week        Progress Toward Goals  OT Goals(current goals can now be found in the care plan section)  Progress towards OT goals: Progressing toward goals  Acute Rehab OT Goals Patient Stated Goal: regain more functional use of L arm OT Goal Formulation: With patient/family Time For Goal Achievement: 11/26/17 Potential to Achieve Goals: Good ADL Goals Pt/caregiver will Perform Home Exercise Program: Left upper extremity;With written HEP provided  Plan Discharge plan remains appropriate    Co-evaluation                 AM-PAC PT "6 Clicks" Daily Activity     Outcome Measure   Help from another person eating meals?: None Help from another person taking care of personal grooming?: A Little Help from another person toileting, which includes using toliet, bedpan, or urinal?: A Lot Help from another person bathing (including washing, rinsing, drying)?: A Lot Help from another person to put on and taking off regular upper body clothing?: A Lot Help from another person to put on and taking off regular lower body clothing?: A Lot 6 Click Score: 15    End of Session Equipment Utilized During Treatment: Other (comment)(sling, cane)  OT Visit Diagnosis: Unsteadiness on feet (R26.81);Pain Pain - Right/Left: Left Pain - part of body: Shoulder   Activity Tolerance Patient tolerated treatment well   Patient Left in chair;with call bell/phone within reach;with family/visitor present   Nurse Communication          Time: 6803-2122 OT Time Calculation (min): 12 min  Charges: OT General Charges $OT Visit: 1 Visit OT Evaluation $OT Eval Low Complexity: 1 Low OT Treatments $Self Care/Home Management : 8-22 mins $Therapeutic Exercise: 8-22 mins  Sandra Wise, OT Acute Rehabilitation Services Pager: [phone] Office: [phone]    Hortencia Pilar 11/19/2017, 12:05 PM

## 2017-11-19 NOTE — Evaluation (Signed)
Occupational Therapy Evaluation Patient Details Name: Sandra Wise MRN: [id number] DOB: [birth year] Today's Date: 11/19/2017    History of Present Illness s/p LEFT REVERSE SHOULDER ARTHROPLASTY.  PMH includes breast cancer, HTN, gout, dyspnea.   Clinical Impression   Pt admitted with the above diagnoses and presents with below problem list. Pt will benefit from continued acute OT to address the below listed deficits and maximize independence with basic ADLs prior to d/c home. PTA pt was mod I with ADLs, used a cane for mobility. Pt is currently min to mod A with grooming/bathing/dressing, min guard to min A with toilet and shower transfers as well as bed mobility. Pt's spouse present and included in shoulder education. Plan to do one more session today to address exercise program prior to d/c home today.     Follow Up Recommendations  Follow surgeon's recommendation for DC plan and follow-up therapies;Supervision/Assistance - 24 hour    Equipment Recommendations  3 in 1 bedside commode    Recommendations for Other Services       Precautions / Restrictions Precautions Precautions: Shoulder;Fall Shoulder Interventions: Shoulder sling/immobilizer;At all times;Off for dressing/bathing/exercises(off in controlled sitting ) Precaution Booklet Issued: Yes (comment) Required Braces or Orthoses: Sling Restrictions Weight Bearing Restrictions: Yes LUE Weight Bearing: Non weight bearing      Mobility Bed Mobility Overal bed mobility: Needs Assistance Bed Mobility: Supine to Sit     Supine to sit: Min assist;HOB elevated     General bed mobility comments: Discussed bed mobility techniques and strategies with pt and spouse.   Transfers Overall transfer level: Needs assistance Equipment used: Straight cane Transfers: Sit to/from Stand Sit to Stand: Min guard         General transfer comment: close min guard for safety. from EOB, comfort height toilet, and recliner    Balance  Overall balance assessment: Needs assistance Sitting-balance support: Single extremity supported;No upper extremity supported;Feet supported Sitting balance-Leahy Scale: Good     Standing balance support: Single extremity supported Standing balance-Leahy Scale: Poor Standing balance comment: external support needed                           ADL either performed or assessed with clinical judgement   ADL Overall ADL's : Needs assistance/impaired Eating/Feeding: Set up;Sitting   Grooming: Moderate assistance;Sitting   Upper Body Bathing: Sitting;Moderate assistance   Lower Body Bathing: Sit to/from stand;Moderate assistance   Upper Body Dressing : Sitting;Moderate assistance   Lower Body Dressing: Sit to/from stand;Moderate assistance   Toilet Transfer: Min guard;Ambulation;Comfort height toilet;Grab bars(cane )   Toileting- Clothing Manipulation and Hygiene: Moderate assistance;Sit to/from stand   Tub/ Shower Transfer: Walk-in shower;Min guard;Minimal assistance;Ambulation;3 in 1(cane)   Functional mobility during ADLs: Min guard;Cane General ADL Comments: Pt completed toilet transfer, pericare, UB/LB dressing, grooming task standing at sink.      Vision         Perception     Praxis      Pertinent Vitals/Pain Pain Assessment: Faces Faces Pain Scale: Hurts a little bit Pain Location: L shoulder Pain Descriptors / Indicators: Sore Pain Intervention(s): Monitored during session;Repositioned     Hand Dominance Right   Extremity/Trunk Assessment Upper Extremity Assessment Upper Extremity Assessment: LUE deficits/detail LUE Deficits / Details: s/p LEFT REVERSE SHOULDER ARTHROPLASTY   Lower Extremity Assessment Lower Extremity Assessment: Defer to PT evaluation       Communication Communication Communication: No difficulties   Cognition Arousal/Alertness: Awake/alert Behavior During Therapy:  WFL for tasks assessed/performed Overall Cognitive  Status: Within Functional Limits for tasks assessed                                     General Comments       Exercises Exercises: Other exercises Other Exercises Other Exercises: e/w/h exercises to toleration, 5 reps in seated position.    Shoulder Instructions      Home Living Family/patient expects to be discharged to:: Private residence Living Arrangements: Spouse/significant other Available Help at Discharge: Family;Available 24 hours/day Type of Home: House Home Access: Ramped entrance     Home Layout: One level     Bathroom Shower/Tub: Walk-in shower         Home Equipment: Crutches;Hand held shower head          Prior Functioning/Environment Level of Independence: Independent with assistive device(s)                 OT Problem List: Impaired balance (sitting and/or standing);Decreased knowledge of use of DME or AE;Decreased knowledge of precautions;Impaired UE functional use;Pain      OT Treatment/Interventions: Self-care/ADL training;Therapeutic exercise;DME and/or AE instruction;Therapeutic activities;Patient/family education;Balance training    OT Goals(Current goals can be found in the care plan section) Acute Rehab OT Goals Patient Stated Goal: regain more functional use of L arm OT Goal Formulation: With patient/family Time For Goal Achievement: 11/26/17 Potential to Achieve Goals: Good ADL Goals Pt/caregiver will Perform Home Exercise Program: Left upper extremity;With written HEP provided  OT Frequency: Min 2X/week   Barriers to D/C:            Co-evaluation              AM-PAC PT "6 Clicks" Daily Activity     Outcome Measure Help from another person eating meals?: None Help from another person taking care of personal grooming?: A Little Help from another person toileting, which includes using toliet, bedpan, or urinal?: A Lot Help from another person bathing (including washing, rinsing, drying)?: A Lot Help  from another person to put on and taking off regular upper body clothing?: A Lot Help from another person to put on and taking off regular lower body clothing?: A Lot 6 Click Score: 15   End of Session Equipment Utilized During Treatment: Other (comment)(sling, cane)  Activity Tolerance: Patient tolerated treatment well Patient left: in chair;with call bell/phone within reach;with family/visitor present  OT Visit Diagnosis: Unsteadiness on feet (R26.81);Pain Pain - Right/Left: Left Pain - part of body: Shoulder                Time: 0932-3557 OT Time Calculation (min): 39 min Charges:  OT General Charges $OT Visit: 1 Visit OT Evaluation $OT Eval Low Complexity: 1 Low OT Treatments $Self Care/Home Management : 8-22 mins  Tyrone Schimke, OT Acute Rehabilitation Services Pager: [phone] Office: [phone]  Hortencia Pilar 11/19/2017, 11:08 AM

## 2017-11-19 NOTE — Progress Notes (Signed)
Pt given discharge instructions. Prescriptions sent to pharmacy already. Instructions went out with pt and husband present. All questions answered. Pt waiting for 3n1 to be delivered and then will be discharged.

## 2017-11-19 NOTE — Discharge Summary (Signed)
PATIENT ID:      Sandra Wise  MRN:     [id number] DOB/AGE:    78-Apr-1942 / 77 y.o.     DISCHARGE SUMMARY  ADMISSION DATE:    11/18/2017 DISCHARGE DATE:    ADMISSION DIAGNOSIS: left shoulder advanced arthritis Past Medical History:  Diagnosis Date  . Arthritis    "knees, lower back, shoulders" (11/18/2017)  . Breast cancer, left breast ([place]) 1991  . Breast cancer, right breast ([place]) 2010  . Dyspnea    with exertion  . Frequency of urination   . GERD (gastroesophageal reflux disease)   . Gout    "on daily RX" (11/18/2017)  . Hypertension     DISCHARGE DIAGNOSIS:   Active Problems:   S/p reverse total shoulder arthroplasty   PROCEDURE: Procedure(s): LEFT REVERSE SHOULDER ARTHROPLASTY on 11/18/2017  CONSULTS:    HISTORY:  See H&P in chart.  HOSPITAL COURSE:  Sandra Wise is a 77 y.o. admitted on 11/18/2017 with a diagnosis of left shoulder advanced arthritis.  They were brought to the operating room on 11/18/2017 and underwent Procedure(s): LEFT REVERSE SHOULDER ARTHROPLASTY.    They were given perioperative antibiotics:  Anti-infectives (From admission, onward)   Start     Dose/Rate Route Frequency Ordered Stop   11/18/17 0945  ceFAZolin (ANCEF) IVPB 2g/100 mL premix     2 g 200 mL/hr over 30 Minutes Intravenous On call to O.R. 11/18/17 0757 11/18/17 1037    .  Patient underwent the above named procedure and tolerated it well. The following day they were hemodynamically stable and pain was controlled on oral analgesics. They were neurovascularly intact to the operative extremity. OT was ordered and worked with patient per protocol. They were medically and orthopaedically stable for discharge on day 1 .   DIAGNOSTIC STUDIES:  RECENT RADIOGRAPHIC STUDIES :  No results found.  RECENT VITAL SIGNS:   Patient Vitals for the past 24 hrs:  BP Temp Pulse Resp SpO2  11/18/17 2334 121/62 - 79 (!) 21 -  11/18/17 1400 (!) 151/78 - 81 16 93 %  11/18/17 1343 - (!) 97.3 F (36.3 C) - -  -  11/18/17 1338 102/88 - 74 14 98 %  11/18/17 1330 - - 78 15 97 %  11/18/17 1323 126/65 - 75 12 97 %  11/18/17 1315 - - 76 16 97 %  11/18/17 1308 128/67 - - 13 -  11/18/17 1300 - - 74 12 99 %  11/18/17 1253 131/64 - 75 11 99 %  11/18/17 1245 - - 76 13 98 %  11/18/17 1238 131/64 - 77 14 100 %  11/18/17 1230 - - 77 15 97 %  11/18/17 1224 - (!) 97 F (36.1 C) 80 18 99 %  11/18/17 1223 132/70 - 83 19 98 %  11/18/17 0949 (!) 143/67 - 92 13 98 %  11/18/17 0935 (!) 155/73 - 89 18 98 %  .  RECENT EKG RESULTS:    Orders placed or performed during the hospital encounter of 11/12/17  . EKG 12 lead  . EKG 12 lead    DISCHARGE INSTRUCTIONS:    DISCHARGE MEDICATIONS:   Allergies as of 11/19/2017      Reactions   Arimidex [anastrozole] Other (See Comments)   Insomnia "did not feel good with it"   Cortisone Rash   Macrobid [nitrofurantoin Macrocrystal] Rash   Prednisone Rash      Medication List    TAKE these medications   allopurinol 100 MG tablet  Commonly known as:  ZYLOPRIM Take 100 mg by mouth daily.   diclofenac sodium 1 % Gel Commonly known as:  VOLTAREN Apply 2-4 g topically 4 (four) times daily as needed (FOR SHOULDER, KNEES ARTHRITIS PAIN.).   doxepin 10 MG capsule Commonly known as:  SINEQUAN Take 10 mg by mouth at bedtime.   latanoprost 0.005 % ophthalmic solution Commonly known as:  XALATAN Place 1 drop into both eyes at bedtime.   lisinopril 20 MG tablet Commonly known as:  PRINIVIL,ZESTRIL Take 20 mg by mouth daily.   LIVER PO Take 1 capsule by mouth daily. LIVAPLEX   multivitamin with minerals Tabs tablet Take 1 tablet by mouth daily.   NON FORMULARY Take 2 capsules by mouth 2 (two) times daily. CATAPLEX G   NON FORMULARY Take 1 capsule by mouth at bedtime. Primal Force Ultra Accel II with BioPQQ & Tocotrienols   OMEGA 3 PO Take 1 capsule by mouth daily. Omega Rejuvenol   ondansetron 4 MG tablet Commonly known as:  ZOFRAN Take 1 tablet (4  mg total) by mouth every 8 (eight) hours as needed for nausea or vomiting.   OVER THE COUNTER MEDICATION Take 1 capsule by mouth 2 (two) times daily. InControl 24 (Bladder Support Supplement)   OVER THE COUNTER MEDICATION Take 1 capsule by mouth 2 (two) times daily. Cardio-Plus   PROBIOTIC PO Take 1 capsule by mouth 2 (two) times daily. ProSynbiotic   SYSTANE 0.4-0.3 % Soln Generic drug:  Polyethyl Glycol-Propyl Glycol Place 1 drop into both eyes 3 (three) times daily as needed (FOR DRY/IRRITATED EYES).   traMADol 50 MG tablet Commonly known as:  ULTRAM Take 1 tablet (50 mg total) by mouth every 6 (six) hours as needed.   TURMERIC PO Take 2 capsules by mouth 2 (two) times daily. Tumeric Forte   TYLENOL 8 HOUR ARTHRITIS PAIN 650 MG CR tablet Generic drug:  acetaminophen Take 650 mg by mouth every 8 (eight) hours as needed for pain.   UNABLE TO FIND Take 2 capsules by mouth 2 (two) times daily. Cataplex F   vitamin C 500 MG tablet Commonly known as:  ASCORBIC ACID Take 500 mg by mouth daily.   Zinc 50 MG Tabs Take 50 mg by mouth daily.       FOLLOW UP VISIT:   Follow-up Information    Justice Britain, MD.   Specialty:  Orthopedic Surgery Why:  call to be seen in 10-14 days Contact information: [address] [place] [address] [phone]           DISCHARGE YH:OOIL   DISCHARGE CONDITION:  [name] [name] for Dr. Lennette Bihari Supple 11/19/2017, 8:16 AM

## 2017-11-19 NOTE — Care Management Note (Signed)
Case Management Note  Patient Details  Name: Sandra Wise MRN: [id number] Date of Birth: May 22, 1940  Subjective/Objective:                    Action/Plan:  3/1 to be delivered to room prior to DC   No other CM needs identified.  Expected Discharge Date:  11/19/17               Expected Discharge Plan:     In-House Referral:     Discharge planning Services  CM Consult  Post Acute Care Choice:  Durable Medical Equipment Choice offered to:     DME Arranged:  3-N-1 DME Agency:  [place]:    [facility] Agency:     Status of Service:  Completed, signed off  If discussed at [place] of Stay Meetings, dates discussed:    Additional Comments:  Carles Collet, RN 11/19/2017, 12:07 PM

## 2018-07-18 ENCOUNTER — Encounter: Admit: 2018-07-18 | Discharge: 2018-07-18 | Payer: MEDICARE

## 2018-07-18 ENCOUNTER — Ambulatory Visit: Admit: 2018-07-18 | Discharge: 2018-07-18 | Payer: MEDICARE

## 2018-07-18 DIAGNOSIS — C50211 Malignant neoplasm of upper-inner quadrant of right female breast: Principal | ICD-10-CM

## 2019-07-18 ENCOUNTER — Ambulatory Visit: Admit: 2019-07-18 | Discharge: 2019-07-19 | Payer: MEDICARE

## 2019-07-18 ENCOUNTER — Encounter: Admit: 2019-07-18 | Discharge: 2019-07-18 | Payer: MEDICARE

## 2019-07-18 DIAGNOSIS — C50211 Malignant neoplasm of upper-inner quadrant of right female breast: Principal | ICD-10-CM

## 2019-07-18 DIAGNOSIS — Z9011 Acquired absence of right breast and nipple: Principal | ICD-10-CM

## 2019-07-18 DIAGNOSIS — Z09 Encounter for follow-up examination after completed treatment for conditions other than malignant neoplasm: Principal | ICD-10-CM

## 2020-07-18 ENCOUNTER — Ambulatory Visit: Admit: 2020-07-18 | Discharge: 2020-07-18 | Payer: MEDICARE

## 2020-07-18 ENCOUNTER — Encounter: Admit: 2020-07-18 | Discharge: 2020-07-18 | Payer: MEDICARE

## 2020-07-18 DIAGNOSIS — C50211 Malignant neoplasm of upper-inner quadrant of right female breast: Principal | ICD-10-CM

## 2020-09-23 NOTE — Progress Notes (Signed)
DUE TO COVID-19 ONLY ONE VISITOR IS ALLOWED TO COME WITH YOU AND STAY IN THE WAITING ROOM ONLY DURING PRE OP AND PROCEDURE DAY OF SURGERY.  2 VISITOR  MAY VISIT WITH YOU AFTER SURGERY IN YOUR PRIVATE ROOM DURING VISITING HOURS ONLY!  YOU NEED TO HAVE A COVID 19 TEST ON_8/12/2020 _____'@_'$  '@_from'$  8am-3pm _____, THIS TEST MUST BE DONE BEFORE SURGERY,  Covid test is done at [place], [place] [address].  This is a drive thru.  No appt required. Please see map.                 Your procedure is scheduled on:  09/30/2020   Report to [facility] Main  Entrance   Report to admitting at 1015    AM     Call this number if you have problems the morning of surgery [phone]    REMEMBER: NO  SOLID FOOD CANDY OR GUM AFTER MIDNIGHT. CLEAR LIQUIDS UNTIL   0945am         . NOTHING BY MOUTH EXCEPT CLEAR LIQUIDS UNTIL    0945am   . PLEASE FINISH ENSURE DRINK PER SURGEON ORDER  WHICH NEEDS TO BE COMPLETED AT  0945am     .      CLEAR LIQUID DIET   Foods Allowed                                                                    Coffee and tea, regular and decaf                            Fruit ices (not with fruit pulp)                                      Iced Popsicles                                    Carbonated beverages, regular and diet                                    Cranberry, grape and apple juices Sports drinks like Gatorade Lightly seasoned clear broth or consume(fat free) Sugar, honey syrup ___________________________________________________________________      BRUSH YOUR TEETH MORNING OF SURGERY AND RINSE YOUR MOUTH OUT, NO CHEWING GUM CANDY OR MINTS.     Take these medicines the morning of surgery with A SIP OF WATER:     Myrbetriq allopurinol  DO NOT TAKE ANY DIABETIC MEDICATIONS DAY OF YOUR SURGERY                               You may not have any metal on your body including hair pins and              piercings  Do not wear jewelry, make-up,  lotions, powders or perfumes, deodorant  Do not wear nail polish on your fingernails.  Do not shave  48 hours prior to surgery.              Men may shave face and neck.   Do not bring valuables to the hospital. [place].  Contacts, dentures or bridgework may not be worn into surgery.  Leave suitcase in the car. After surgery it may be brought to your room.     Patients discharged the day of surgery will not be allowed to drive home. IF YOU ARE HAVING SURGERY AND GOING HOME THE SAME DAY, YOU MUST HAVE AN ADULT TO DRIVE YOU HOME AND BE WITH YOU FOR 24 HOURS. YOU MAY GO HOME BY TAXI OR UBER OR ORTHERWISE, BUT AN ADULT MUST ACCOMPANY YOU HOME AND STAY WITH YOU FOR 24 HOURS.  Name and phone number of your driver:  Special Instructions: N/A              Please read over the following fact sheets you were given: _____________________________________________________________________  [facility] - Preparing for Surgery Before surgery, you can play an important role.  Because skin is not sterile, your skin needs to be as free of germs as possible.  You can reduce the number of germs on your skin by washing with CHG (chlorahexidine gluconate) soap before surgery.  CHG is an antiseptic cleaner which kills germs and bonds with the skin to continue killing germs even after washing. Please DO NOT use if you have an allergy to CHG or antibacterial soaps.  If your skin becomes reddened/irritated stop using the CHG and inform your nurse when you arrive at Short Stay. Do not shave (including legs and underarms) for at least 48 hours prior to the first CHG shower.  You may shave your face/neck. Please follow these instructions carefully:  1.  Shower with CHG Soap the night before surgery and the  morning of Surgery.  2.  If you choose to wash your hair, wash your hair first as usual with your  normal  shampoo.  3.  After you shampoo, rinse your hair  and body thoroughly to remove the  shampoo.                           4.  Use CHG as you would any other liquid soap.  You can apply chg directly  to the skin and wash                       Gently with a scrungie or clean washcloth.  5.  Apply the CHG Soap to your body ONLY FROM THE NECK DOWN.   Do not use on face/ open                           Wound or open sores. Avoid contact with eyes, ears mouth and genitals (private parts).                       Wash face,  Genitals (private parts) with your normal soap.             6.  Wash thoroughly, paying special attention to the area where your surgery  will be performed.  7.  Thoroughly rinse your body with  warm water from the neck down.  8.  DO NOT shower/wash with your normal soap after using and rinsing off  the CHG Soap.                9.  Pat yourself dry with a clean towel.            10.  Wear clean pajamas.            11.  Place clean sheets on your bed the night of your first shower and do not  sleep with pets. Day of Surgery : Do not apply any lotions/deodorants the morning of surgery.  Please wear clean clothes to the hospital/surgery center.  FAILURE TO FOLLOW THESE INSTRUCTIONS MAY RESULT IN THE CANCELLATION OF YOUR SURGERY PATIENT SIGNATURE_________________________________  NURSE SIGNATURE__________________________________  ________________________________________________________________________

## 2020-09-24 ENCOUNTER — Encounter (HOSPITAL_COMMUNITY): Payer: Self-pay

## 2020-09-24 ENCOUNTER — Encounter (HOSPITAL_COMMUNITY)
Admission: RE | Admit: 2020-09-24 | Discharge: 2020-09-24 | Disposition: A | Discharge status: Home / Self Care | Payer: Medicare Other | Source: Ambulatory Visit | Attending: Orthopedic Surgery | Admitting: Orthopedic Surgery

## 2020-09-24 ENCOUNTER — Other Ambulatory Visit: Payer: Self-pay

## 2020-09-24 DIAGNOSIS — Z01812 Encounter for preprocedural laboratory examination: Secondary | ICD-10-CM | POA: Insufficient documentation

## 2020-09-24 HISTORY — DX: Cardiac murmur, unspecified: R01.1

## 2020-09-24 LAB — SURGICAL PCR SCREEN
MRSA, PCR: NEGATIVE
Staphylococcus aureus: NEGATIVE

## 2020-09-24 NOTE — Progress Notes (Addendum)
Anesthesia Review:  PCP: DR Carlisle Beers 09/13/20( clearance)  LOV 09/11/20 - on chart  Cardiologist : none  Chest x-ray : EKG : 09/11/20 on chart  Echo : Stress test: Cardiac Cath :  Activity level: unable to do a flight of stairs  Sleep Study/ CPAP : none  Fasting Blood Sugar :      / Checks Blood Sugar -- times a day:   Blood Thinner/ Instructions /Last Dose: ASA / Instructions/ Last Dose :   09/11/20- labs of CBc/Diff, pt, CMP and U/A on chart

## 2020-09-26 ENCOUNTER — Other Ambulatory Visit: Payer: Self-pay | Admitting: Orthopedic Surgery

## 2020-09-26 LAB — SARS CORONAVIRUS 2 (TAT 6-24 HRS): SARS Coronavirus 2: NEGATIVE

## 2020-09-29 MED ORDER — BUPIVACAINE LIPOSOME 1.3 % IJ SUSP
20.0000 mL | Freq: Once | INTRAMUSCULAR | Status: DC
  Filled 2020-09-29: qty 20

## 2020-09-30 ENCOUNTER — Other Ambulatory Visit: Payer: Self-pay

## 2020-09-30 ENCOUNTER — Encounter (HOSPITAL_COMMUNITY): Payer: Self-pay | Admitting: Orthopedic Surgery

## 2020-09-30 ENCOUNTER — Ambulatory Visit (HOSPITAL_COMMUNITY): Payer: Medicare Other | Admitting: Registered Nurse

## 2020-09-30 ENCOUNTER — Inpatient Hospital Stay (HOSPITAL_COMMUNITY)
Admission: RE | Admit: 2020-09-30 | Discharge: 2020-10-07 | DRG: 470 | Disposition: A | Discharge status: Skilled Nursing Facility | Payer: Medicare Other | Attending: Orthopedic Surgery | Admitting: Orthopedic Surgery

## 2020-09-30 ENCOUNTER — Ambulatory Visit (HOSPITAL_COMMUNITY): Payer: Medicare Other | Admitting: Physician Assistant

## 2020-09-30 ENCOUNTER — Encounter (HOSPITAL_COMMUNITY)
Admission: RE | Disposition: A | Discharge status: Skilled Nursing Facility | Payer: Self-pay | Source: Home / Self Care | Attending: Orthopedic Surgery

## 2020-09-30 DIAGNOSIS — E669 Obesity, unspecified: Secondary | ICD-10-CM | POA: Diagnosis present

## 2020-09-30 DIAGNOSIS — M109 Gout, unspecified: Secondary | ICD-10-CM | POA: Diagnosis present

## 2020-09-30 DIAGNOSIS — Z79899 Other long term (current) drug therapy: Secondary | ICD-10-CM

## 2020-09-30 DIAGNOSIS — Z9013 Acquired absence of bilateral breasts and nipples: Secondary | ICD-10-CM

## 2020-09-30 DIAGNOSIS — I10 Essential (primary) hypertension: Secondary | ICD-10-CM | POA: Diagnosis present

## 2020-09-30 DIAGNOSIS — M1710 Unilateral primary osteoarthritis, unspecified knee: Secondary | ICD-10-CM | POA: Diagnosis present

## 2020-09-30 DIAGNOSIS — Z9889 Other specified postprocedural states: Secondary | ICD-10-CM

## 2020-09-30 DIAGNOSIS — K219 Gastro-esophageal reflux disease without esophagitis: Secondary | ICD-10-CM | POA: Diagnosis present

## 2020-09-30 DIAGNOSIS — Z6838 Body mass index (BMI) 38.0-38.9, adult: Secondary | ICD-10-CM

## 2020-09-30 DIAGNOSIS — M179 Osteoarthritis of knee, unspecified: Secondary | ICD-10-CM | POA: Diagnosis present

## 2020-09-30 DIAGNOSIS — Z96612 Presence of left artificial shoulder joint: Secondary | ICD-10-CM | POA: Diagnosis present

## 2020-09-30 DIAGNOSIS — Z888 Allergy status to other drugs, medicaments and biological substances status: Secondary | ICD-10-CM

## 2020-09-30 DIAGNOSIS — Z90710 Acquired absence of both cervix and uterus: Secondary | ICD-10-CM

## 2020-09-30 DIAGNOSIS — Z20822 Contact with and (suspected) exposure to covid-19: Secondary | ICD-10-CM | POA: Diagnosis present

## 2020-09-30 DIAGNOSIS — M1712 Unilateral primary osteoarthritis, left knee: Principal | ICD-10-CM | POA: Diagnosis present

## 2020-09-30 DIAGNOSIS — Z853 Personal history of malignant neoplasm of breast: Secondary | ICD-10-CM

## 2020-09-30 HISTORY — PX: TOTAL KNEE ARTHROPLASTY: SHX125

## 2020-09-30 LAB — TYPE AND SCREEN
ABO/RH(D): A POS
Antibody Screen: NEGATIVE

## 2020-09-30 LAB — ABO/RH: ABO/RH(D): A POS

## 2020-09-30 SURGERY — ARTHROPLASTY, KNEE, TOTAL
Anesthesia: General | Site: Knee | Laterality: Left

## 2020-09-30 MED ORDER — ROCURONIUM BROMIDE 10 MG/ML (PF) SYRINGE
PREFILLED_SYRINGE | INTRAVENOUS | Status: DC | PRN
  Administered 2020-09-30: 50 mg via INTRAVENOUS

## 2020-09-30 MED ORDER — SUGAMMADEX SODIUM 200 MG/2ML IV SOLN
INTRAVENOUS | Status: DC | PRN
  Administered 2020-09-30: 200 mg via INTRAVENOUS

## 2020-09-30 MED ORDER — DOCUSATE SODIUM 100 MG PO CAPS
100.0000 mg | ORAL_CAPSULE | Freq: Two times a day (BID) | ORAL | Status: DC
  Administered 2020-09-30 – 2020-10-07 (×14): 100 mg via ORAL
  Filled 2020-09-30 (×14): qty 1

## 2020-09-30 MED ORDER — METHOCARBAMOL 500 MG IVPB - SIMPLE MED
500.0000 mg | Freq: Four times a day (QID) | INTRAVENOUS | Status: DC | PRN
  Administered 2020-09-30: 500 mg via INTRAVENOUS
  Filled 2020-09-30: qty 50

## 2020-09-30 MED ORDER — BUPIVACAINE LIPOSOME 1.3 % IJ SUSP
INTRAMUSCULAR | Status: DC | PRN
  Administered 2020-09-30: 20 mL

## 2020-09-30 MED ORDER — OXYCODONE HCL 5 MG/5ML PO SOLN
5.0000 mg | Freq: Once | ORAL | Status: AC | PRN
Start: 2020-09-30 — End: 2020-09-30

## 2020-09-30 MED ORDER — TRANEXAMIC ACID-NACL 1000-0.7 MG/100ML-% IV SOLN
1000.0000 mg | INTRAVENOUS | Status: AC
  Administered 2020-09-30: 1000 mg via INTRAVENOUS
  Filled 2020-09-30: qty 100

## 2020-09-30 MED ORDER — DOXEPIN HCL 10 MG PO CAPS
10.0000 mg | ORAL_CAPSULE | Freq: Every day | ORAL | Status: DC
  Administered 2020-09-30 – 2020-10-06 (×7): 10 mg via ORAL
  Filled 2020-09-30 (×8): qty 1

## 2020-09-30 MED ORDER — FENTANYL CITRATE (PF) 100 MCG/2ML IJ SOLN
INTRAMUSCULAR | Status: AC
  Administered 2020-09-30: 50 ug via INTRAVENOUS
  Filled 2020-09-30: qty 2

## 2020-09-30 MED ORDER — SODIUM CHLORIDE 0.9 % IV SOLN: INTRAVENOUS | Status: DC

## 2020-09-30 MED ORDER — DEXAMETHASONE SODIUM PHOSPHATE 10 MG/ML IJ SOLN
INTRAMUSCULAR | Status: AC
  Filled 2020-09-30: qty 1

## 2020-09-30 MED ORDER — FENTANYL CITRATE (PF) 100 MCG/2ML IJ SOLN
INTRAMUSCULAR | Status: AC
  Filled 2020-09-30: qty 2

## 2020-09-30 MED ORDER — CEFAZOLIN SODIUM-DEXTROSE 2-4 GM/100ML-% IV SOLN
2.0000 g | Freq: Four times a day (QID) | INTRAVENOUS | Status: AC
  Administered 2020-09-30 – 2020-10-01 (×2): 2 g via INTRAVENOUS
  Filled 2020-09-30 (×2): qty 100

## 2020-09-30 MED ORDER — POLYETHYL GLYCOL-PROPYL GLYCOL 0.4-0.3 % OP SOLN: 1.0000 [drp] | Freq: Three times a day (TID) | OPHTHALMIC | Status: DC | PRN

## 2020-09-30 MED ORDER — METHOCARBAMOL 500 MG PO TABS
500.0000 mg | ORAL_TABLET | Freq: Four times a day (QID) | ORAL | Status: DC | PRN
  Administered 2020-09-30 – 2020-10-07 (×14): 500 mg via ORAL
  Filled 2020-09-30 (×14): qty 1

## 2020-09-30 MED ORDER — PHENOL 1.4 % MT LIQD: 1.0000 | OROMUCOSAL | Status: DC | PRN

## 2020-09-30 MED ORDER — CEFAZOLIN SODIUM-DEXTROSE 2-4 GM/100ML-% IV SOLN
2.0000 g | INTRAVENOUS | Status: AC
Start: 2020-09-30 — End: 2020-09-30
  Administered 2020-09-30: 2 g via INTRAVENOUS
  Filled 2020-09-30: qty 100

## 2020-09-30 MED ORDER — METHOCARBAMOL 500 MG IVPB - SIMPLE MED
INTRAVENOUS | Status: AC
  Filled 2020-09-30: qty 50

## 2020-09-30 MED ORDER — ONDANSETRON HCL 4 MG/2ML IJ SOLN
INTRAMUSCULAR | Status: AC
  Filled 2020-09-30: qty 2

## 2020-09-30 MED ORDER — ONDANSETRON HCL 4 MG/2ML IJ SOLN: 4.0000 mg | Freq: Four times a day (QID) | INTRAMUSCULAR | Status: DC | PRN

## 2020-09-30 MED ORDER — POLYVINYL ALCOHOL 1.4 % OP SOLN
1.0000 [drp] | OPHTHALMIC | Status: DC | PRN
  Filled 2020-09-30: qty 15

## 2020-09-30 MED ORDER — METOCLOPRAMIDE HCL 5 MG/ML IJ SOLN: 5.0000 mg | Freq: Three times a day (TID) | INTRAMUSCULAR | Status: DC | PRN

## 2020-09-30 MED ORDER — FENTANYL CITRATE (PF) 100 MCG/2ML IJ SOLN
50.0000 ug | INTRAMUSCULAR | Status: DC
  Administered 2020-09-30: 50 ug via INTRAVENOUS
  Filled 2020-09-30: qty 2

## 2020-09-30 MED ORDER — DIPHENHYDRAMINE HCL 12.5 MG/5ML PO ELIX: 12.5000 mg | ORAL_SOLUTION | ORAL | Status: DC | PRN

## 2020-09-30 MED ORDER — SODIUM CHLORIDE (PF) 0.9 % IJ SOLN
INTRAMUSCULAR | Status: DC | PRN
  Administered 2020-09-30: 60 mL

## 2020-09-30 MED ORDER — OXYCODONE HCL 5 MG PO TABS: 5.0000 mg | ORAL_TABLET | Freq: Once | ORAL | Status: AC | PRN

## 2020-09-30 MED ORDER — LATANOPROST 0.005 % OP SOLN
1.0000 [drp] | Freq: Every day | OPHTHALMIC | Status: DC
  Administered 2020-09-30 – 2020-10-06 (×7): 1 [drp] via OPHTHALMIC
  Filled 2020-09-30: qty 2.5

## 2020-09-30 MED ORDER — POVIDONE-IODINE 10 % EX SWAB
2.0000 "application " | Freq: Once | CUTANEOUS | Status: AC
  Administered 2020-09-30: 2 via TOPICAL

## 2020-09-30 MED ORDER — PROPOFOL 10 MG/ML IV BOLUS
INTRAVENOUS | Status: AC
  Filled 2020-09-30: qty 20

## 2020-09-30 MED ORDER — POLYETHYLENE GLYCOL 3350 17 G PO PACK
17.0000 g | PACK | Freq: Every day | ORAL | Status: DC | PRN
  Administered 2020-10-02: 17 g via ORAL
  Filled 2020-09-30: qty 1

## 2020-09-30 MED ORDER — FLEET ENEMA 7-19 GM/118ML RE ENEM
1.0000 | ENEMA | Freq: Once | RECTAL | Status: AC | PRN
  Administered 2020-10-07: 1 via RECTAL
  Filled 2020-09-30: qty 1

## 2020-09-30 MED ORDER — BISACODYL 10 MG RE SUPP
10.0000 mg | Freq: Every day | RECTAL | Status: DC | PRN
Start: 2020-09-30 — End: 2020-10-03

## 2020-09-30 MED ORDER — ACETAMINOPHEN 500 MG PO TABS
1000.0000 mg | ORAL_TABLET | Freq: Four times a day (QID) | ORAL | Status: AC
  Administered 2020-09-30 – 2020-10-01 (×4): 1000 mg via ORAL
  Filled 2020-09-30 (×4): qty 2

## 2020-09-30 MED ORDER — RIVAROXABAN 10 MG PO TABS
10.0000 mg | ORAL_TABLET | Freq: Every day | ORAL | Status: DC
  Administered 2020-10-01 – 2020-10-07 (×7): 10 mg via ORAL
  Filled 2020-09-30 (×7): qty 1

## 2020-09-30 MED ORDER — OXYCODONE HCL 5 MG PO TABS
5.0000 mg | ORAL_TABLET | ORAL | Status: DC | PRN
  Administered 2020-09-30: 5 mg via ORAL
  Administered 2020-10-01 – 2020-10-07 (×16): 10 mg via ORAL
  Filled 2020-09-30 (×17): qty 2

## 2020-09-30 MED ORDER — FENTANYL CITRATE (PF) 100 MCG/2ML IJ SOLN
25.0000 ug | INTRAMUSCULAR | Status: DC | PRN
  Administered 2020-09-30: 50 ug via INTRAVENOUS

## 2020-09-30 MED ORDER — SODIUM CHLORIDE 0.9 % IR SOLN
Status: DC | PRN
  Administered 2020-09-30 (×2): 1000 mL

## 2020-09-30 MED ORDER — FENTANYL CITRATE (PF) 100 MCG/2ML IJ SOLN
INTRAMUSCULAR | Status: DC | PRN
  Administered 2020-09-30: 50 ug via INTRAVENOUS
  Administered 2020-09-30: 100 ug via INTRAVENOUS
  Administered 2020-09-30 (×4): 50 ug via INTRAVENOUS

## 2020-09-30 MED ORDER — FENTANYL CITRATE (PF) 250 MCG/5ML IJ SOLN
INTRAMUSCULAR | Status: AC
  Filled 2020-09-30: qty 5

## 2020-09-30 MED ORDER — OXYCODONE HCL 5 MG PO TABS
ORAL_TABLET | ORAL | Status: AC
  Administered 2020-09-30: 5 mg via ORAL
  Filled 2020-09-30: qty 1

## 2020-09-30 MED ORDER — METOCLOPRAMIDE HCL 5 MG PO TABS
5.0000 mg | ORAL_TABLET | Freq: Three times a day (TID) | ORAL | Status: DC | PRN
Start: 2020-09-30 — End: 2020-10-07

## 2020-09-30 MED ORDER — MIRABEGRON ER 25 MG PO TB24
25.0000 mg | ORAL_TABLET | Freq: Every day | ORAL | Status: DC
  Administered 2020-10-01 – 2020-10-07 (×7): 25 mg via ORAL
  Filled 2020-09-30 (×8): qty 1

## 2020-09-30 MED ORDER — BUPIVACAINE-EPINEPHRINE (PF) 0.5% -1:200000 IJ SOLN
INTRAMUSCULAR | Status: DC | PRN
  Administered 2020-09-30: 15 mL via PERINEURAL

## 2020-09-30 MED ORDER — ACETAMINOPHEN 325 MG PO TABS
325.0000 mg | ORAL_TABLET | Freq: Four times a day (QID) | ORAL | Status: DC | PRN
  Administered 2020-10-02 – 2020-10-06 (×4): 650 mg via ORAL
  Filled 2020-09-30 (×4): qty 2

## 2020-09-30 MED ORDER — ALLOPURINOL 100 MG PO TABS
100.0000 mg | ORAL_TABLET | Freq: Every day | ORAL | Status: DC
  Administered 2020-10-01 – 2020-10-07 (×7): 100 mg via ORAL
  Filled 2020-09-30 (×8): qty 1

## 2020-09-30 MED ORDER — PROPOFOL 10 MG/ML IV BOLUS
INTRAVENOUS | Status: DC | PRN
  Administered 2020-09-30: 160 mg via INTRAVENOUS
  Administered 2020-09-30: 40 mg via INTRAVENOUS

## 2020-09-30 MED ORDER — LIDOCAINE 2% (20 MG/ML) 5 ML SYRINGE
INTRAMUSCULAR | Status: DC | PRN
  Administered 2020-09-30: 100 mg via INTRAVENOUS

## 2020-09-30 MED ORDER — MENTHOL 3 MG MT LOZG
1.0000 | LOZENGE | OROMUCOSAL | Status: DC | PRN
  Administered 2020-09-30: 3 mg via ORAL
  Filled 2020-09-30: qty 9

## 2020-09-30 MED ORDER — MORPHINE SULFATE (PF) 4 MG/ML IV SOLN
1.0000 mg | INTRAVENOUS | Status: DC | PRN
  Administered 2020-09-30: 1 mg via INTRAVENOUS
  Filled 2020-09-30: qty 1

## 2020-09-30 MED ORDER — CLONIDINE HCL (ANALGESIA) 100 MCG/ML EP SOLN
EPIDURAL | Status: DC | PRN
  Administered 2020-09-30: 50 ug

## 2020-09-30 MED ORDER — LIP MEDEX EX OINT
TOPICAL_OINTMENT | CUTANEOUS | Status: AC
  Filled 2020-09-30: qty 7

## 2020-09-30 MED ORDER — LACTATED RINGERS IV SOLN: INTRAVENOUS | Status: DC

## 2020-09-30 MED ORDER — ACETAMINOPHEN 10 MG/ML IV SOLN
1000.0000 mg | Freq: Four times a day (QID) | INTRAVENOUS | Status: DC
  Administered 2020-09-30: 1000 mg via INTRAVENOUS
  Filled 2020-09-30: qty 100

## 2020-09-30 MED ORDER — PHENYLEPHRINE HCL (PRESSORS) 10 MG/ML IV SOLN
INTRAVENOUS | Status: AC
  Filled 2020-09-30: qty 2

## 2020-09-30 MED ORDER — ONDANSETRON HCL 4 MG/2ML IJ SOLN
INTRAMUSCULAR | Status: DC | PRN
  Administered 2020-09-30: 4 mg via INTRAVENOUS

## 2020-09-30 MED ORDER — TRAMADOL HCL 50 MG PO TABS
50.0000 mg | ORAL_TABLET | Freq: Four times a day (QID) | ORAL | Status: DC | PRN
  Administered 2020-09-30 – 2020-10-07 (×8): 100 mg via ORAL
  Filled 2020-09-30 (×8): qty 2

## 2020-09-30 MED ORDER — ONDANSETRON HCL 4 MG PO TABS: 4.0000 mg | ORAL_TABLET | Freq: Four times a day (QID) | ORAL | Status: DC | PRN

## 2020-09-30 SURGICAL SUPPLY — 52 items
AUGMENT PFC SIG RP SZ2.5 12.5M (Knees) ×1 IMPLANT
BAG COUNTER SPONGE SURGICOUNT (BAG) IMPLANT
BAG ZIPLOCK 12X15 (MISCELLANEOUS) ×2 IMPLANT
BLADE SAG 18X100X1.27 (BLADE) ×2 IMPLANT
BLADE SAW SGTL 11.0X1.19X90.0M (BLADE) ×2 IMPLANT
BNDG ELASTIC 6X5.8 VLCR STR LF (GAUZE/BANDAGES/DRESSINGS) ×2 IMPLANT
BOWL SMART MIX CTS (DISPOSABLE) ×2 IMPLANT
CEMENT HV SMART SET (Cement) ×4 IMPLANT
CEMENT TIBIA MBT SIZE 2.5 (Knees) ×1 IMPLANT
COVER SURGICAL LIGHT HANDLE (MISCELLANEOUS) ×2 IMPLANT
CUFF TOURN SGL QUICK 34 (TOURNIQUET CUFF) ×1
CUFF TRNQT CYL 34X4.125X (TOURNIQUET CUFF) ×1 IMPLANT
DECANTER SPIKE VIAL GLASS SM (MISCELLANEOUS) ×2 IMPLANT
DRAPE U-SHAPE 47X51 STRL (DRAPES) ×2 IMPLANT
DRSG AQUACEL AG ADV 3.5X10 (GAUZE/BANDAGES/DRESSINGS) ×2 IMPLANT
DURAPREP 26ML APPLICATOR (WOUND CARE) ×2 IMPLANT
ELECT REM PT RETURN 15FT ADLT (MISCELLANEOUS) ×2 IMPLANT
FEMUR SIGMA PS SZ 2.5 L (Knees) ×2 IMPLANT
GLOVE SRG 8 PF TXTR STRL LF DI (GLOVE) ×1 IMPLANT
GLOVE SURG ENC MOIS LTX SZ6.5 (GLOVE) ×2 IMPLANT
GLOVE SURG ENC MOIS LTX SZ8 (GLOVE) ×4 IMPLANT
GLOVE SURG UNDER POLY LF SZ7 (GLOVE) ×2 IMPLANT
GLOVE SURG UNDER POLY LF SZ8 (GLOVE) ×1
GLOVE SURG UNDER POLY LF SZ8.5 (GLOVE) ×2 IMPLANT
GOWN STRL REUS W/TWL LRG LVL3 (GOWN DISPOSABLE) ×4 IMPLANT
GOWN STRL REUS W/TWL XL LVL3 (GOWN DISPOSABLE) ×2 IMPLANT
HANDPIECE INTERPULSE COAX TIP (DISPOSABLE) ×1
HOLDER FOLEY CATH W/STRAP (MISCELLANEOUS) ×2 IMPLANT
IMMOBILIZER KNEE 20 (SOFTGOODS) ×2
IMMOBILIZER KNEE 20 THIGH 36 (SOFTGOODS) ×1 IMPLANT
KIT TURNOVER KIT A (KITS) ×2 IMPLANT
MANIFOLD NEPTUNE II (INSTRUMENTS) ×2 IMPLANT
NS IRRIG 1000ML POUR BTL (IV SOLUTION) ×2 IMPLANT
PACK TOTAL KNEE CUSTOM (KITS) ×2 IMPLANT
PADDING CAST COTTON 6X4 STRL (CAST SUPPLIES) ×4 IMPLANT
PATELLA DOME PFC 35MM (Knees) ×2 IMPLANT
PENCIL SMOKE EVACUATOR (MISCELLANEOUS) ×2 IMPLANT
PFC SIGMA RP STB SZ 2.5 12.5M (Knees) ×2 IMPLANT
PIN STEINMAN FIXATION KNEE (PIN) ×2 IMPLANT
PROTECTOR NERVE ULNAR (MISCELLANEOUS) ×2 IMPLANT
SET HNDPC FAN SPRY TIP SCT (DISPOSABLE) ×1 IMPLANT
SPONGE T-LAP 18X18 ~~LOC~~+RFID (SPONGE) ×4 IMPLANT
STRIP CLOSURE SKIN 1/2X4 (GAUZE/BANDAGES/DRESSINGS) ×4 IMPLANT
SUT MNCRL AB 4-0 PS2 18 (SUTURE) ×2 IMPLANT
SUT STRATAFIX 0 PDS 27 VIOLET (SUTURE) ×2
SUT VIC AB 2-0 CT1 27 (SUTURE) ×3
SUT VIC AB 2-0 CT1 TAPERPNT 27 (SUTURE) ×3 IMPLANT
SUTURE STRATFX 0 PDS 27 VIOLET (SUTURE) ×1 IMPLANT
TIBIA MBT CEMENT SIZE 2.5 (Knees) ×2 IMPLANT
TUBE SUCTION HIGH CAP CLEAR NV (SUCTIONS) ×2 IMPLANT
WATER STERILE IRR 1000ML POUR (IV SOLUTION) ×4 IMPLANT
WRAP KNEE MAXI GEL POST OP (GAUZE/BANDAGES/DRESSINGS) ×2 IMPLANT

## 2020-09-30 NOTE — Anesthesia Preprocedure Evaluation (Addendum)
Anesthesia Evaluation  Patient identified by MRN, date of birth, ID band Patient awake    Reviewed: Allergy & Precautions, NPO status , Patient's Chart, lab work & pertinent test results  History of Anesthesia Complications Negative for: history of anesthetic complications  Airway Mallampati: II  TM Distance: >3 FB Neck ROM: Full    Dental no notable dental hx.    Pulmonary neg pulmonary ROS,    Pulmonary exam normal        Cardiovascular hypertension, Pt. on medications Normal cardiovascular exam     Neuro/Psych negative neurological ROS  negative psych ROS   GI/Hepatic Neg liver ROS, GERD  ,  Endo/Other  negative endocrine ROS  Renal/GU negative Renal ROS  negative genitourinary   Musculoskeletal  (+) Arthritis ,   Abdominal   Peds  Hematology negative hematology ROS (+)   Anesthesia Other Findings Day of surgery medications reviewed with patient.  Reproductive/Obstetrics negative OB ROS                            Anesthesia Physical Anesthesia Plan  ASA: 2  Anesthesia Plan: General   Post-op Pain Management: GA combined w/ Regional for post-op pain   Induction: Intravenous  PONV Risk Score and Plan: 4 or greater and Treatment may vary due to age or medical condition, Ondansetron and Dexamethasone  Airway Management Planned: Oral ETT  Additional Equipment: None  Intra-op Plan:   Post-operative Plan: Extubation in OR  Informed Consent: I have reviewed the patients History and Physical, chart, labs and discussed the procedure including the risks, benefits and alternatives for the proposed anesthesia with the patient or authorized representative who has indicated his/her understanding and acceptance.     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 Daiva Huge, MD)      Anesthesia Quick Evaluation

## 2020-09-30 NOTE — Progress Notes (Signed)
Assisted Dr. Daiva Huge with left, ultrasound guided, adductor canal block. Side rails up, monitors on throughout procedure. See vital signs in flow sheet. Tolerated Procedure well.

## 2020-09-30 NOTE — Plan of Care (Signed)

## 2020-09-30 NOTE — Discharge Instructions (Addendum)
Gaynelle Arabian, MD [facility] [address], [address] [place], [place] [address] [phone]  TOTAL KNEE REPLACEMENT POSTOPERATIVE DIRECTIONS    Knee Rehabilitation, Guidelines Following Surgery  Results after knee surgery are often greatly improved when you follow the exercise, range of motion and muscle strengthening exercises prescribed by your doctor. Safety measures are also important to protect the knee from further injury. If any of these exercises cause you to have increased pain or swelling in your knee joint, decrease the amount until you are comfortable again and slowly increase them. If you have problems or questions, call your caregiver or physical therapist for advice.   BLOOD CLOT PREVENTION Take a 10 mg Xarelto once a day for three weeks following surgery. Then take an 81 mg Aspirin once a day for three weeks. Then discontinue Aspirin. You may resume your vitamins/supplements once you have discontinued the Xarelto. Do not take any NSAIDs (Advil, Aleve, Ibuprofen, Meloxicam, etc.) until you have discontinued the Xarelto.   HOME CARE INSTRUCTIONS  Remove items at home which could result in a fall. This includes throw rugs or furniture in walking pathways.  ICE to the affected knee as much as tolerated. Icing helps control swelling. If the swelling is well controlled you will be more comfortable and rehab easier. Continue to use ice on the knee for pain and swelling from surgery. You may notice swelling that will progress down to the foot and ankle. This is normal after surgery. Elevate the leg when you are not up walking on it.    Continue to use the breathing machine which will help keep your temperature down. It is common for your temperature to cycle up and down following surgery, especially at night when you are not up moving around and exerting yourself. The breathing machine keeps your lungs expanded and your temperature  down. Do not place pillow under the operative knee, focus on keeping the knee straight while resting  DIET You may resume your previous home diet once you are discharged from the hospital.  DRESSING / WOUND CARE / SHOWERING Keep your bulky bandage on for 2 days. On the third post-operative day you may remove the Ace bandage and gauze. There is a waterproof adhesive bandage on your skin which will stay in place until your first follow-up appointment. Once you remove this you will not need to place another bandage You may begin showering 3 days following surgery, but do not submerge the incision under water.  ACTIVITY For the first 5 days, the key is rest and control of pain and swelling Do your home exercises twice a day starting on post-operative day 3. On the days you go to physical therapy, just do the home exercises once that day. You should rest, ice and elevate the leg for 50 minutes out of every hour. Get up and walk/stretch for 10 minutes per hour. After 5 days you can increase your activity slowly as tolerated. Walk with your walker as instructed. Use the walker until you are comfortable transitioning to a cane. Walk with the cane in the opposite hand of the operative leg. You may discontinue the cane once you are comfortable and walking steadily. Avoid periods of inactivity such as sitting longer than an hour when not asleep. This helps prevent blood clots.  You may discontinue the knee immobilizer once you are able to perform a straight leg raise while lying down. You may resume a sexual relationship in one month or  when given the OK by your doctor.  You may return to work once you are cleared by your doctor.  Do not drive a car for 6 weeks or until released by your surgeon.  Do not drive while taking narcotics.  TED HOSE STOCKINGS Wear the elastic stockings on both legs for three weeks following surgery during the day. You may remove them at night for sleeping.  WEIGHT  BEARING Weight bearing as tolerated with assist device (walker, cane, etc) as directed, use it as long as suggested by your surgeon or therapist, typically at least 4-6 weeks.  POSTOPERATIVE CONSTIPATION PROTOCOL Constipation - defined medically as fewer than three stools per week and severe constipation as less than one stool per week.  One of the most common issues patients have following surgery is constipation.  Even if you have a regular bowel pattern at home, your normal regimen is likely to be disrupted due to multiple reasons following surgery.  Combination of anesthesia, postoperative narcotics, change in appetite and fluid intake all can affect your bowels.  In order to avoid complications following surgery, here are some recommendations in order to help you during your recovery period.  Colace (docusate) - Pick up an over-the-counter form of Colace or another stool softener and take twice a day as long as you are requiring postoperative pain medications.  Take with a full glass of water daily.  If you experience loose stools or diarrhea, hold the colace until you stool forms back up. If your symptoms do not get better within 1 week or if they get worse, check with your doctor. Dulcolax (bisacodyl) - Pick up over-the-counter and take as directed by the product packaging as needed to assist with the movement of your bowels.  Take with a full glass of water.  Use this product as needed if not relieved by Colace only.  MiraLax (polyethylene glycol) - Pick up over-the-counter to have on hand. MiraLax is a solution that will increase the amount of water in your bowels to assist with bowel movements.  Take as directed and can mix with a glass of water, juice, soda, coffee, or tea. Take if you go more than two days without a movement. Do not use MiraLax more than once per day. Call your doctor if you are still constipated or irregular after using this medication for 7 days in a row.  If you continue  to have problems with postoperative constipation, please contact the office for further assistance and recommendations.  If you experience "the worst abdominal pain ever" or develop nausea or vomiting, please contact the office immediatly for further recommendations for treatment.  ITCHING If you experience itching with your medications, try taking only a single pain pill, or even half a pain pill at a time.  You can also use Benadryl over the counter for itching or also to help with sleep.   MEDICATIONS See your medication summary on the "After Visit Summary" that the nursing staff will review with you prior to discharge.  You may have some home medications which will be placed on hold until you complete the course of blood thinner medication.  It is important for you to complete the blood thinner medication as prescribed by your surgeon.  Continue your approved medications as instructed at time of discharge.  PRECAUTIONS If you experience chest pain or shortness of breath - call 911 immediately for transfer to the hospital emergency department.  If you develop a fever greater that 101 F, purulent  drainage from wound, increased redness or drainage from wound, foul odor from the wound/dressing, or calf pain - CONTACT YOUR SURGEON.                                                   FOLLOW-UP APPOINTMENTS Make sure you keep all of your appointments after your operation with your surgeon and caregivers. You should call the office at the above phone number and make an appointment for approximately two weeks after the date of your surgery or on the date instructed by your surgeon outlined in the "After Visit Summary".  RANGE OF MOTION AND STRENGTHENING EXERCISES  Rehabilitation of the knee is important following a knee injury or an operation. After just a few days of immobilization, the muscles of the thigh which control the knee become weakened and shrink (atrophy). Knee exercises are designed to build up  the tone and strength of the thigh muscles and to improve knee motion. Often times heat used for twenty to thirty minutes before working out will loosen up your tissues and help with improving the range of motion but do not use heat for the first two weeks following surgery. These exercises can be done on a training (exercise) mat, on the floor, on a table or on a bed. Use what ever works the best and is most comfortable for you Knee exercises include:  Leg Lifts - While your knee is still immobilized in a splint or cast, you can do straight leg raises. Lift the leg to 60 degrees, hold for 3 sec, and slowly lower the leg. Repeat 10-20 times 2-3 times daily. Perform this exercise against resistance later as your knee gets better.  Quad and Hamstring Sets - Tighten up the muscle on the front of the thigh (Quad) and hold for 5-10 sec. Repeat this 10-20 times hourly. Hamstring sets are done by pushing the foot backward against an object and holding for 5-10 sec. Repeat as with quad sets.  Leg Slides: Lying on your back, slowly slide your foot toward your buttocks, bending your knee up off the floor (only go as far as is comfortable). Then slowly slide your foot back down until your leg is flat on the floor again. Angel Wings: Lying on your back spread your legs to the side as far apart as you can without causing discomfort.  A rehabilitation program following serious knee injuries can speed recovery and prevent re-injury in the future due to weakened muscles. Contact your doctor or a physical therapist for more information on knee rehabilitation.   POST-OPERATIVE OPIOID TAPER INSTRUCTIONS: It is important to wean off of your opioid medication as soon as possible. If you do not need pain medication after your surgery it is ok to stop day one. Opioids include: Codeine, Hydrocodone(Norco, Vicodin), Oxycodone(Percocet, oxycontin) and hydromorphone amongst others.  Long term and even short term use of opiods can  cause: Increased pain response Dependence Constipation Depression Respiratory depression And more.  Withdrawal symptoms can include Flu like symptoms Nausea, vomiting And more Techniques to manage these symptoms Hydrate well Eat regular healthy meals Stay active Use relaxation techniques(deep breathing, meditating, yoga) Do Not substitute Alcohol to help with tapering If you have been on opioids for less than two weeks and do not have pain than it is ok to stop all together.  Plan to   wean off of opioids This plan should start within one week post op of your joint replacement. Maintain the same interval or time between taking each dose and first decrease the dose.  Cut the total daily intake of opioids by one tablet each day Next start to increase the time between doses. The last dose that should be eliminated is the evening dose.   IF YOU ARE TRANSFERRED TO A SKILLED REHAB FACILITY If the patient is transferred to a skilled rehab facility following release from the hospital, a list of the current medications will be sent to the facility for the patient to continue.  When discharged from the skilled rehab facility, please have the facility set up the patient's [place] prior to being released. Also, the skilled facility will be responsible for providing the patient with their medications at time of release from the facility to include their pain medication, the muscle relaxants, and their blood thinner medication. If the patient is still at the rehab facility at time of the two week follow up appointment, the skilled rehab facility will also need to assist the patient in arranging follow up appointment in our office and any transportation needs.  MAKE SURE YOU:  Understand these instructions.  Get help right away if you are not doing well or get worse.   DENTAL ANTIBIOTICS:  In most cases prophylactic antibiotics for Dental procdeures after total joint surgery are  not necessary.  Exceptions are as follows:  1. History of prior total joint infection  2. Severely immunocompromised (Organ Transplant, cancer chemotherapy, Rheumatoid biologic meds such as [place])  3. Poorly controlled diabetes (A1C &gt; 8.0, blood glucose over 200)  If you have one of these conditions, contact your surgeon for an antibiotic prescription, prior to your dental procedure.    Pick up stool softner and laxative for home use following surgery while on pain medications. Do not submerge incision under water. Please use good hand washing techniques while changing dressing each day. May shower starting three days after surgery. Please use a clean towel to pat the incision dry following showers. Continue to use ice for pain and swelling after surgery. Do not use any lotions or creams on the incision until instructed by your surgeon.  Information on my medicine - XARELTO (Rivaroxaban)  This medication education was reviewed with me or my healthcare representative as part of my discharge preparation.  The pharmacist that spoke with me during my hospital stay was:    Why was Xarelto prescribed for you? Xarelto was prescribed for you to reduce the risk of blood clots forming after orthopedic surgery. The medical term for these abnormal blood clots is venous thromboembolism (VTE).  What do you need to know about xarelto ? Take your Xarelto ONCE DAILY at the same time every day. You may take it either with or without food.  If you have difficulty swallowing the tablet whole, you may crush it and mix in applesauce just prior to taking your dose.  Take Xarelto exactly as prescribed by your doctor and DO NOT stop taking Xarelto without talking to the doctor who prescribed the medication.  Stopping without other VTE prevention medication to take the place of Xarelto may increase your risk of developing a clot.  After discharge, you should have regular check-up appointments  with your healthcare provider that is prescribing your Xarelto.    What do you do if you miss a dose? If you miss a dose, take it as soon  as you remember on the same day then continue your regularly scheduled once daily regimen the next day. Do not take two doses of Xarelto on the same day.   Important Safety Information A possible side effect of Xarelto is bleeding. You should call your healthcare provider right away if you experience any of the following: Bleeding from an injury or your nose that does not stop. Unusual colored urine (red or dark brown) or unusual colored stools (red or black). Unusual bruising for unknown reasons. A serious fall or if you hit your head (even if there is no bleeding).  Some medicines may interact with Xarelto and might increase your risk of bleeding while on Xarelto. To help avoid this, consult your healthcare provider or pharmacist prior to using any new prescription or non-prescription medications, including herbals, vitamins, non-steroidal anti-inflammatory drugs (NSAIDs) and supplements.  This website has more information on Xarelto: [email or web address]

## 2020-09-30 NOTE — H&P (Signed)
TOTAL KNEE ADMISSION H&P  Patient is being admitted for left total knee arthroplasty.  Subjective:  Chief Complaint: Left knee pain.  HPI: Sandra Wise, 80 y.o. female has a history of pain and functional disability in the left knee due to arthritis and has failed non-surgical conservative treatments for greater than 12 weeks to include NSAID's and/or analgesics and activity modification. Onset of symptoms was gradual, starting  several  years ago with gradually worsening course since that time. The patient noted no past surgery on the left knee.  Patient currently rates pain in the left knee at 6 out of 10 with activity. Patient has worsening of pain with activity and weight bearing, pain that interferes with activities of daily living, and pain with passive range of motion. Patient has evidence of periarticular osteophytes and joint space narrowing by imaging studies. There is no active infection.  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    Past Medical History:  Diagnosis Date   Arthritis    "knees, lower back, shoulders" (11/18/2017)   Breast cancer, left breast ([place]) 1991   Breast cancer, right breast ([place]) 2010   Dyspnea    with exertion   Frequency of urination    GERD (gastroesophageal reflux disease)    Gout    "on daily RX" (11/18/2017)   Heart murmur    Hypertension     Past Surgical History:  Procedure Laterality Date   BREAST BIOPSY Left 1991   BREAST BIOPSY Right 2010   CYST EXCISION Left 1983   cyst on hip bone   DILATION AND CURETTAGE OF UTERUS     MASTECTOMY Left 1991   MASTECTOMY Right 2010   REVERSE SHOULDER ARTHROPLASTY Left 11/18/2017   REVERSE SHOULDER ARTHROPLASTY Left 11/18/2017   Procedure: LEFT REVERSE SHOULDER ARTHROPLASTY;  Surgeon: Justice Britain, MD;  Location: [facility];  Service: Orthopedics;  Laterality: Left;  111mn   VAGINAL HYSTERECTOMY      Prior to Admission medications    Medication Sig Start Date End Date Taking? Authorizing Provider  acetaminophen (TYLENOL) 650 MG CR tablet Take 650 mg by mouth every 8 (eight) hours as needed for pain.   Yes [provider]  allopurinol (ZYLOPRIM) 100 MG tablet Take 100 mg by mouth daily.   Yes [provider]  diclofenac sodium (VOLTAREN) 1 % GEL Apply 2-4 g topically 4 (four) times daily as needed (back, knee and shoulder pain).   Yes [provider]  doxepin (SINEQUAN) 10 MG capsule Take 10 mg by mouth at bedtime.   Yes [provider]  latanoprost (XALATAN) 0.005 % ophthalmic solution Place 1 drop into both eyes at bedtime.   Yes [provider]  lisinopril (PRINIVIL,ZESTRIL) 20 MG tablet Take 20 mg by mouth daily.   Yes [provider]  mirabegron ER (MYRBETRIQ) 25 MG TB24 tablet Take 25 mg by mouth daily.   Yes [provider]  Multiple Vitamin (MULTIVITAMIN WITH MINERALS) TABS tablet Take 1 tablet by mouth daily.   Yes [provider]  Polyethyl Glycol-Propyl Glycol 0.4-0.3 % SOLN Place 1 drop into both eyes 3 (three) times daily as needed (FOR DRY/IRRITATED EYES).   Yes [provider]  sodium chloride (OCEAN) 0.65 % SOLN nasal spray Place 1 spray into both nostrils as needed for congestion.   Yes [provider]  TURMERIC PO Take 1 capsule by mouth 2 (two) times daily.   Yes [provider]  vitamin C (ASCORBIC ACID) 500 MG tablet Take 500 mg by mouth daily.   Yes [provider]  Zinc 50 MG TABS Take 50 mg by mouth daily.   Yes [provider]  ondansetron (ZOFRAN) 4 MG tablet Take 1 tablet (4 mg total) by mouth every 8 (eight) hours as needed for nausea or vomiting. Patient not taking: Reported on 09/19/2020 11/19/17   Shuford, Olivia Mackie, PA-C  traMADol (ULTRAM) 50 MG tablet Take 1 tablet (50 mg total) by mouth every 6 (six) hours as needed. Patient not taking: Reported on 09/19/2020 11/19/17   Shuford, Olivia Mackie,  PA-C    Allergies  Allergen Reactions   Arimidex [Anastrozole] Other (See Comments)    Insomnia "did not feel good with it"   Cortisone Rash   Macrobid [Nitrofurantoin Macrocrystal] Rash   Other Rash    Brown cloth bandaids   Prednisone Rash    Social History   Socioeconomic History   Marital status: Married    Spouse name: Not on file   Number of children: Not on file   Years of education: Not on file   Highest education level: Not on file  Occupational History   Not on file  Tobacco Use   Smoking status: Never   Smokeless tobacco: Never  Vaping Use   Vaping Use: Never used  Substance and Sexual Activity   Alcohol use: Never   Drug use: Never   Sexual activity: Not Currently  Other Topics Concern   Not on file  Social History Narrative   Not on file   Social Determinants of Health   Financial Resource Strain: Not on file  Food Insecurity: Not on file  Transportation Needs: Not on file  Physical Activity: Not on file  Stress: Not on file  Social Connections: Not on file  Intimate Partner Violence: Not on file    Tobacco Use: Low Risk    Smoking Tobacco Use: Never   Smokeless Tobacco Use: Never   Social History   Substance and Sexual Activity  Alcohol Use Never    No family history on file.  ROS: Constitutional: no fever, no chills, no night sweats, no significant weight loss Cardiovascular: no chest pain, no palpitations Respiratory: no cough, no shortness of breath, No COPD Gastrointestinal: no vomiting, no nausea Musculoskeletal: no swelling in Joints, Joint Pain Neurologic: no numbness, no tingling, no difficulty with balance   Objective:  Physical Exam: Well nourished and well developed.  General: Alert and oriented x3, cooperative and pleasant, no acute distress.  Head: normocephalic, atraumatic, neck supple.  Eyes: EOMI.  Respiratory: breath sounds clear in all fields, no wheezing, rales, or rhonchi. Cardiovascular: Regular rate and  rhythm, no murmurs, gallops or rubs.  Abdomen: non-tender to palpation and soft, normoactive bowel sounds. Musculoskeletal:   Left Hip Exam:   Range of motion: Normal without discomfort.     Left Knee Exam:   No effusion present. No swelling present.   Range of motion: 5 to 110 degrees.   Marked crepitus on range of motion of the knee.   Positive medial greater than lateral joint line tenderness.   The knee is stable.     The patient's sensation and motor function are intact in their lower extremities. Their distal pulses are 2+. The bilateral calves are soft and non-tender.   Calves soft and nontender. Motor function intact in LE. Strength 5/5 LE bilaterally. Neuro: Distal pulses 2+. Sensation to light touch intact in LE.  Vital signs in last 24 hours:    Imaging Review AP and lateral of the left knee dated 11/2019 demonstrate bone-on-bone arthritis in the medial and patellofemoral compartments.  Assessment/Plan:  End stage arthritis, left knee   The patient history, physical examination, clinical judgment of the provider and imaging studies are consistent with end stage degenerative joint disease of the left knee and total knee arthroplasty is deemed medically necessary. The treatment options including medical management, injection therapy arthroscopy and arthroplasty were discussed at length. The risks and benefits of total knee arthroplasty were presented and reviewed. The risks due to aseptic loosening, infection, stiffness, patella tracking problems, thromboembolic complications and other imponderables were discussed. The patient acknowledged the explanation, agreed to proceed with the plan and consent was signed. Patient is being admitted for inpatient treatment for surgery, pain control, PT, OT, prophylactic antibiotics, VTE prophylaxis, progressive ambulation and ADLs and discharge planning. The patient is planning to be discharged  home .   Patient's anticipated LOS is  less than 2 midnights, meeting these requirements: - Lives within 1 hour of care - Has a competent adult at home to recover with post-op - NO history of  - Chronic pain requiring opioids  - Diabetes  - Coronary Artery Disease  - Heart failure  - Heart attack  - Stroke  - DVT/VTE  - Cardiac arrhythmia  - Respiratory Failure/COPD  - Renal failure  - Anemia  - Advanced Liver disease    Therapy Plans: [facility] Disposition: Home with husband Planned DVT Prophylaxis: Xarelto '10mg'$  (History of Breast CA) DME Needed: [facility] PCP: Carlisle Beers, MD TXA: IV Allergies: Prednisone (rash), Cortisone (rash), Macrobid (rash), Anastrozole Anesthesia Concerns: None BMI: 39.3 Last HgbA1c: N/A  Pharmacy: [facility]  - Patient was instructed on what medications to stop prior to surgery. - Follow-up visit in 2 weeks with Dr. Wynelle Link - Begin physical therapy following surgery - Pre-operative lab work as pre-surgical testing - Prescriptions will be provided in hospital at time of discharge  Fenton Foy, [facility], PA-C Orthopedic Surgery [facility] [place]

## 2020-09-30 NOTE — Op Note (Signed)
OPERATIVE REPORT-TOTAL KNEE ARTHROPLASTY   Pre-operative diagnosis- Osteoarthritis  Left knee(s)  Post-operative diagnosis- Osteoarthritis Left knee(s)  Procedure-  Left  Total Knee Arthroplasty  Surgeon- Dione Plover. Rosanne Wohlfarth, MD  Assistant- [name], PA-C   Anesthesia-  GA combined with regional for post-op pain  EBL-50 mL   Drains None  Tourniquet time-  Total Tourniquet Time Documented: Thigh (Left) - 40 minutes Total: Thigh (Left) - 40 minutes     Complications- None  Condition-PACU - hemodynamically stable.   Brief Clinical Note  Sandra Wise is a 80 y.o. year old female with end stage OA of her left knee with progressively worsening pain and dysfunction. She has constant pain, with activity and at rest and significant functional deficits with difficulties even with ADLs. She has had extensive non-op management including analgesics, injections of cortisone and viscosupplements, and home exercise program, but remains in significant pain with significant dysfunction. Radiographs show bone on bone arthritis medial and patellofemoral. She presents now for left Total Knee Arthroplasty.     Procedure in detail---   The patient is brought into the operating room and positioned supine on the operating table. After successful administration of  GA combined with regional for post-op pain,   a tourniquet is placed high on the  Left thigh(s) and the lower extremity is prepped and draped in the usual sterile fashion. Time out is performed by the operating team and then the  Left lower extremity is wrapped in Esmarch, knee flexed and the tourniquet inflated to 300 mmHg.       A midline incision is made with a ten blade through the subcutaneous tissue to the level of the extensor mechanism. A fresh blade is used to make a medial parapatellar arthrotomy. Soft tissue over the proximal medial tibia is subperiosteally elevated to the joint line with a knife and into the semimembranosus  bursa with a Cobb elevator. Soft tissue over the proximal lateral tibia is elevated with attention being paid to avoiding the patellar tendon on the tibial tubercle. The patella is everted, knee flexed 90 degrees and the ACL and PCL are removed. Findings are bone on bone medial and patellofemoral with large global osteophytes        The drill is used to create a starting hole in the distal femur and the canal is thoroughly irrigated with sterile saline to remove the fatty contents. The 5 degree Left  valgus alignment guide is placed into the femoral canal and the distal femoral cutting block is pinned to remove 10 mm off the distal femur. Resection is made with an oscillating saw.      The tibia is subluxed forward and the menisci are removed. The extramedullary alignment guide is placed referencing proximally at the medial aspect of the tibial tubercle and distally along the second metatarsal axis and tibial crest. The block is pinned to remove 45m off the more deficient medial  side. Resection is made with an oscillating saw. Size 2.5is the most appropriate size for the tibia and the proximal tibia is prepared with the modular drill and keel punch for that size.      The femoral sizing guide is placed and size 2.5 is most appropriate. Rotation is marked off the epicondylar axis and confirmed by creating a rectangular flexion gap at 90 degrees. The size 2.5 cutting block is pinned in this rotation and the anterior, posterior and chamfer cuts are made with the oscillating saw. The intercondylar block is then placed and that  cut is made.      Trial size 2.5 tibial component, trial size 2.5 posterior stabilized femur and a 12.5  mm posterior stabilized rotating platform insert trial is placed. Full extension is achieved with excellent varus/valgus and anterior/posterior balance throughout full range of motion. The patella is everted and thickness measured to be 21  mm. Free hand resection is taken to 12 mm, a 35  template is placed, lug holes are drilled, trial patella is placed, and it tracks normally. Osteophytes are removed off the posterior femur with the trial in place. All trials are removed and the cut bone surfaces prepared with pulsatile lavage. Cement is mixed and once ready for implantation, the size 2.5 tibial implant, size  2.5 posterior stabilized femoral component, and the size 35 patella are cemented in place and the patella is held with the clamp. The trial insert is placed and the knee held in full extension. The Exparel (20 ml mixed with 60 ml saline) is injected into the extensor mechanism, posterior capsule, medial and lateral gutters and subcutaneous tissues.  All extruded cement is removed and once the cement is hard the permanent 12.5 mm posterior stabilized rotating platform insert is placed into the tibial tray.      The wound is copiously irrigated with saline solution and the extensor mechanism closed with # 0 Stratofix suture. The tourniquet is released for a total tourniquet time of 40  minutes. Flexion against gravity is 130 degrees and the patella tracks normally. Subcutaneous tissue is closed with 2.0 vicryl and subcuticular with running 4.0 Monocryl. The incision is cleaned and dried and steri-strips and a bulky sterile dressing are applied. The limb is placed into a knee immobilizer and the patient is awakened and transported to recovery in stable condition.      Please note that a surgical assistant was a medical necessity for this procedure in order to perform it in a safe and expeditious manner. Surgical assistant was necessary to retract the ligaments and vital neurovascular structures to prevent injury to them and also necessary for proper positioning of the limb to allow for anatomic placement of the prosthesis.   Dione Plover Federick Levene, MD    09/30/2020, 2:44 PM

## 2020-09-30 NOTE — Transfer of Care (Signed)
Immediate Anesthesia Transfer of Care Note  Patient: Sandra Wise  Procedure(s) Performed: TOTAL KNEE ARTHROPLASTY (Left: Knee)  Patient Location: PACU  Anesthesia Type:General  Level of Consciousness: awake, alert , oriented and patient cooperative  Airway & Oxygen Therapy: Patient Spontanous Breathing and Patient connected to face mask oxygen  Post-op Assessment: Report given to RN, Post -op Vital signs reviewed and stable and Patient moving all extremities X 4  Post vital signs: stable  Last Vitals:  Vitals Value Taken Time  BP 140/55 09/30/20 1506  Temp    Pulse 79 09/30/20 1510  Resp 11 09/30/20 1510  SpO2 99 % 09/30/20 1510  Vitals shown include unvalidated device data.  Last Pain:  Vitals:   09/30/20 1303  TempSrc:   PainSc: 0-No pain      Patients Stated Pain Goal: 3 ([id number])  Complications: No notable events documented.

## 2020-09-30 NOTE — Progress Notes (Signed)
PT Cancellation Note  Patient Details Name: Sandra Wise MRN: [id number] DOB: 1940-10-30   Cancelled Treatment:     PT order received and eval attempted x2 but deferred at pt request 2* elevated pain level.  Will follow.   Evanell Redlich 09/30/2020, 7:10 PM

## 2020-09-30 NOTE — Anesthesia Procedure Notes (Signed)
  Anesthesia Regional Block: Adductor canal block   Pre-Anesthetic Checklist: , timeout performed,  Correct Patient, Correct Site, Correct Laterality,  Correct Procedure, Correct Position, site marked,  Risks and benefits discussed,  Pre-op evaluation,  At surgeon's request and post-op pain management  Laterality: Left  Prep: Maximum Sterile Barrier Precautions used, chloraprep       Needles:  Injection technique: Single-shot  Needle Type: Echogenic Stimulator Needle     Needle Length: 9cm  Needle Gauge: 22     Additional Needles:   Procedures:,,,, ultrasound used (permanent image in chart),,    Narrative:  Start time: 09/30/2020 12:48 PM End time: 09/30/2020 12:51 PM Injection made incrementally with aspirations every 5 mL.  Performed by: Personally  Anesthesiologist: Brennan Bailey, MD  Additional Notes: Risks, benefits, and alternative discussed. Patient gave consent for procedure. Patient prepped and draped in sterile fashion. Sedation administered, patient remains easily responsive to voice. Relevant anatomy identified with ultrasound guidance. Local anesthetic given in 5cc increments with no signs or symptoms of intravascular injection. No pain or paraesthesias with injection. Patient monitored throughout procedure with signs of LAST or immediate complications. Tolerated well. Ultrasound image placed in chart.  Tawny Asal, MD

## 2020-09-30 NOTE — Interval H&P Note (Signed)
History and Physical Interval Note:  09/30/2020 10:51 AM  Sandra Wise  has presented today for surgery, with the diagnosis of Left knee osteoarthritis.  The various methods of treatment have been discussed with the patient and family. After consideration of risks, benefits and other options for treatment, the patient has consented to  Procedure(s): TOTAL KNEE ARTHROPLASTY (Left) as a surgical intervention.  The patient's history has been reviewed, patient examined, no change in status, stable for surgery.  I have reviewed the patient's chart and labs.  Questions were answered to the patient's satisfaction.     Pilar Plate Aleksis Jiggetts

## 2020-09-30 NOTE — Anesthesia Procedure Notes (Signed)
Procedure Name: Intubation Date/Time: 09/30/2020 1:20 PM Performed by: Lissa Morales, CRNA Pre-anesthesia Checklist: Patient identified, Emergency Drugs available, Suction available and Patient being monitored Patient Re-evaluated:Patient Re-evaluated prior to induction Oxygen Delivery Method: Circle system utilized Preoxygenation: Pre-oxygenation with 100% oxygen Induction Type: IV induction Ventilation: Mask ventilation without difficulty Laryngoscope Size: Mac and 4 Grade View: Grade III Tube type: Oral Tube size: 7.0 mm Number of attempts: 1 Airway Equipment and Method: Stylet and Oral airway Placement Confirmation: ETT inserted through vocal cords under direct vision, positive ETCO2 and breath sounds checked- equal and bilateral Secured at: 22 cm Tube secured with: Tape Dental Injury: Teeth and Oropharynx as per pre-operative assessment  Difficulty Due To: Difficulty was anticipated, Difficult Airway- due to anterior larynx, Difficult Airway- due to limited oral opening and Difficult Airway- due to large tongue Comments: Visualized posterior arytenoids and placed ETT through posterior rim of cords without trauma

## 2020-09-30 NOTE — Anesthesia Postprocedure Evaluation (Signed)
Anesthesia Post Note  Patient: Sandra Wise  Procedure(s) Performed: TOTAL KNEE ARTHROPLASTY (Left: Knee)     Patient location during evaluation: PACU Anesthesia Type: General Level of consciousness: awake and alert and oriented Pain management: pain level controlled Vital Signs Assessment: post-procedure vital signs reviewed and stable Respiratory status: spontaneous breathing, nonlabored ventilation and respiratory function stable Cardiovascular status: blood pressure returned to baseline Postop Assessment: no apparent nausea or vomiting Anesthetic complications: no   No notable events documented.  Last Vitals:  Vitals:   09/30/20 1605 09/30/20 1621  BP: 140/68 (!) 144/76  Pulse: 75 75  Resp: 17 17  Temp: (!) 36.4 C 36.4 C  SpO2: 95% 96%                  Brennan Bailey

## 2020-10-01 ENCOUNTER — Other Ambulatory Visit (HOSPITAL_COMMUNITY): Payer: Self-pay

## 2020-10-01 ENCOUNTER — Encounter (HOSPITAL_COMMUNITY): Payer: Self-pay | Admitting: Orthopedic Surgery

## 2020-10-01 DIAGNOSIS — Z90710 Acquired absence of both cervix and uterus: Secondary | ICD-10-CM | POA: Diagnosis not present

## 2020-10-01 DIAGNOSIS — Z79899 Other long term (current) drug therapy: Secondary | ICD-10-CM | POA: Diagnosis not present

## 2020-10-01 DIAGNOSIS — K219 Gastro-esophageal reflux disease without esophagitis: Secondary | ICD-10-CM | POA: Diagnosis present

## 2020-10-01 DIAGNOSIS — Z96612 Presence of left artificial shoulder joint: Secondary | ICD-10-CM | POA: Diagnosis present

## 2020-10-01 DIAGNOSIS — Z6838 Body mass index (BMI) 38.0-38.9, adult: Secondary | ICD-10-CM | POA: Diagnosis not present

## 2020-10-01 DIAGNOSIS — I10 Essential (primary) hypertension: Secondary | ICD-10-CM | POA: Diagnosis present

## 2020-10-01 DIAGNOSIS — Z20822 Contact with and (suspected) exposure to covid-19: Secondary | ICD-10-CM | POA: Diagnosis present

## 2020-10-01 DIAGNOSIS — Z9013 Acquired absence of bilateral breasts and nipples: Secondary | ICD-10-CM | POA: Diagnosis not present

## 2020-10-01 DIAGNOSIS — Z853 Personal history of malignant neoplasm of breast: Secondary | ICD-10-CM | POA: Diagnosis not present

## 2020-10-01 DIAGNOSIS — E669 Obesity, unspecified: Secondary | ICD-10-CM | POA: Diagnosis present

## 2020-10-01 DIAGNOSIS — M109 Gout, unspecified: Secondary | ICD-10-CM | POA: Diagnosis present

## 2020-10-01 DIAGNOSIS — M1712 Unilateral primary osteoarthritis, left knee: Secondary | ICD-10-CM | POA: Diagnosis present

## 2020-10-01 DIAGNOSIS — Z888 Allergy status to other drugs, medicaments and biological substances status: Secondary | ICD-10-CM | POA: Diagnosis not present

## 2020-10-01 LAB — CBC
HCT: 34 % — ABNORMAL LOW (ref 36.0–46.0)
Hemoglobin: 11.2 g/dL — ABNORMAL LOW (ref 12.0–15.0)
MCH: 32.7 pg (ref 26.0–34.0)
MCHC: 32.9 g/dL (ref 30.0–36.0)
MCV: 99.4 fL (ref 80.0–100.0)
Platelets: 208 10*3/uL (ref 150–400)
RBC: 3.42 MIL/uL — ABNORMAL LOW (ref 3.87–5.11)
RDW: 13.2 % (ref 11.5–15.5)
WBC: 8.4 10*3/uL (ref 4.0–10.5)
nRBC: 0 % (ref 0.0–0.2)

## 2020-10-01 LAB — BASIC METABOLIC PANEL
Anion gap: 6 (ref 5–15)
BUN: 19 mg/dL (ref 8–23)
CO2: 24 mmol/L (ref 22–32)
Calcium: 8.2 mg/dL — ABNORMAL LOW (ref 8.9–10.3)
Chloride: 105 mmol/L (ref 98–111)
Creatinine, Ser: 1.05 mg/dL — ABNORMAL HIGH (ref 0.44–1.00)
GFR, Estimated: 53 mL/min — ABNORMAL LOW (ref >60)
Glucose, Bld: 124 mg/dL — ABNORMAL HIGH (ref 70–99)
Potassium: 4 mmol/L (ref 3.5–5.1)
Sodium: 135 mmol/L (ref 135–145)

## 2020-10-01 MED ORDER — METHOCARBAMOL 500 MG PO TABS
500.0000 mg | ORAL_TABLET | Freq: Four times a day (QID) | ORAL | 0 refills | Status: DC | PRN
  Filled 2020-10-01: qty 40, 10d supply, fill #0

## 2020-10-01 MED ORDER — RIVAROXABAN 10 MG PO TABS
10.0000 mg | ORAL_TABLET | Freq: Every day | ORAL | 0 refills | Status: DC
  Filled 2020-10-01: qty 20, 20d supply, fill #0

## 2020-10-01 MED ORDER — TRAMADOL HCL 50 MG PO TABS
50.0000 mg | ORAL_TABLET | Freq: Four times a day (QID) | ORAL | 0 refills | Status: DC | PRN
  Filled 2020-10-01: qty 40, 5d supply, fill #0

## 2020-10-01 MED ORDER — OXYCODONE HCL 5 MG PO TABS
5.0000 mg | ORAL_TABLET | Freq: Four times a day (QID) | ORAL | 0 refills | Status: DC | PRN
  Filled 2020-10-01: qty 42, 7d supply, fill #0

## 2020-10-01 NOTE — Plan of Care (Signed)
  Problem: Health Behavior/Discharge Planning: Goal: Ability to manage health-related needs will improve 10/01/2020 2158 by Williams Che, RN Outcome: Progressing 10/01/2020 0825 by Williams Che, RN Outcome: Progressing   Problem: Clinical Measurements: Goal: Ability to maintain clinical measurements within normal limits will improve 10/01/2020 2158 by Williams Che, RN Outcome: Progressing 10/01/2020 0825 by Williams Che, RN Outcome: Progressing   Problem: Activity: Goal: Risk for activity intolerance will decrease 10/01/2020 2158 by Williams Che, RN Outcome: Progressing 10/01/2020 0825 by Williams Che, RN Outcome: Progressing   Problem: Nutrition: Goal: Adequate nutrition will be maintained 10/01/2020 2158 by Williams Che, RN Outcome: Progressing 10/01/2020 0825 by Williams Che, RN Outcome: Progressing   Problem: Coping: Goal: Level of anxiety will decrease 10/01/2020 2158 by Williams Che, RN Outcome: Progressing 10/01/2020 0825 by Williams Che, RN Outcome: Progressing   Problem: Elimination: Goal: Will not experience complications related to bowel motility 10/01/2020 2158 by Williams Che, RN Outcome: Progressing 10/01/2020 0825 by Williams Che, RN Outcome: Progressing   Problem: Pain Managment: Goal: General experience of comfort will improve 10/01/2020 2158 by Williams Che, RN Outcome: Progressing 10/01/2020 0825 by Williams Che, RN Outcome: Progressing   Problem: Safety: Goal: Ability to remain free from injury will improve 10/01/2020 2158 by Williams Che, RN Outcome: Progressing 10/01/2020 0825 by Williams Che, RN Outcome: Progressing   Problem: Skin Integrity: Goal: Risk for impaired skin integrity will decrease 10/01/2020 2158 by Williams Che, RN Outcome: Progressing 10/01/2020 0825 by Williams Che, RN Outcome: Progressing

## 2020-10-01 NOTE — TOC Transition Note (Signed)
Transition of Care [facility]) - CM/SW Discharge Note  Patient Details  Name: Sandra Wise MRN: [id number] Date of Birth: 11/01/1940  [facility] CM/SW Contact:  Sherie Don, LCSW Phone Number: 10/01/2020, 11:13 AM  Clinical Narrative: Patient is expected to discharge home after working with PT. CSW met with patient to confirm discharge plan. Patient will go to OPPT at [facility]). Patient has high toilets at home, but will need a rolling walker. [employer] to deliver walker to patient's room. TOC signing off.  Final next level of care: OP Rehab Barriers to Discharge: No Barriers Identified  Patient Goals and CMS Choice Patient states their goals for this hospitalization and ongoing recovery are:: Discharge home with [place] CMS Medicare.gov Compare Post Acute Care list provided to:: Patient Choice offered to / list presented to : Patient  Discharge Plan and Services         DME Arranged: Walker rolling DME Agency: [employer] Representative spoke with at DME Agency: Pre-arranged in orthopedist's office  Readmission Risk Interventions No flowsheet data found.

## 2020-10-01 NOTE — Evaluation (Signed)
Physical Therapy Evaluation Patient Details Name: Sandra Wise MRN: [id number] DOB: 07-Oct-1940 Today's Date: 10/01/2020   History of Present Illness  80 yo female s/p L TKA 09/30/20. Hx of L rev TSA 2019, breast ca, gout  Clinical Impression  On eval, pt required Mod A +2 for mobility. She was able to take a couple of steps in room with a RW. Pain rated 8/10 with activity. High fall risk. Unfortunately, pt will likely not meet her PT goals on today-made RN aware. Will continue to follow and progress activity as tolerated.     Follow Up Recommendations Follow surgeon's recommendation for DC plan and follow-up therapies;Supervision/Assistance - 24 hour    Equipment Recommendations  Rolling walker with 5" wheels    Recommendations for Other Services OT consult     Precautions / Restrictions Precautions Precautions: Fall;Knee Restrictions Weight Bearing Restrictions: No Other Position/Activity Restrictions: WBAT      Mobility  Bed Mobility Overal bed mobility: Needs Assistance Bed Mobility: Supine to Sit     Supine to sit: Mod assist;HOB elevated     General bed mobility comments: Assist for trunk and L LE. Increased time. Task is effortful for pt. Cues for safety, technique. (Husband can only provide small amount of assist 2* bad back)    Transfers Overall transfer level: Needs assistance Equipment used: Rolling walker (2 wheeled) Transfers: Sit to/from [facility] Sit to Stand: Mod assist;+2 physical assistance;+2 safety/equipment Stand pivot transfers: Min assist;+2 physical assistance;+2 safety/equipment       General transfer comment: Assist to rise, steady, control descent. Cues for safety, technique, hand/LE placement. Poor balance-near LOB posteriorly. Stand piviot to recliner with RW.  Ambulation/Gait Ambulation/Gait assistance: Min assist;+2 physical assistance;+2 safety/equipment Gait Distance (Feet): 2 Feet Assistive device: Rolling walker (2  wheeled) Gait Pattern/deviations: Step-to pattern;Antalgic     General Gait Details: Pt took a step or two forward with RW. Limited by weaknss, pain, and fatigue. Followed closely with recliner. High fall risk. Mod VCs for safety, posture, proper use of RW, sequence.  Stairs            Wheelchair Mobility    Modified Rankin (Stroke Patients Only)       Balance Overall balance assessment: Needs assistance         Standing balance support: Bilateral upper extremity supported Standing balance-Leahy Scale: Poor                               Pertinent Vitals/Pain Pain Assessment: 0-10 Pain Score: 8  Pain Location: L knee Pain Descriptors / Indicators: Discomfort;Sore;Aching;Sharp Pain Intervention(s): Limited activity within patient's tolerance;Monitored during session;Repositioned;Ice applied    Home Living Family/patient expects to be discharged to:: Private residence Living Arrangements: Spouse/significant other Available Help at Discharge: Family Type of Home: House Home Access: [place]: Able to live on main level with bedroom/bathroom Home Equipment: [place] - 2 wheels;Cane - single point      Prior Function Level of Independence: Independent               Hand Dominance        Extremity/Trunk Assessment   Upper Extremity Assessment Upper Extremity Assessment: Generalized weakness    Lower Extremity Assessment Lower Extremity Assessment: Generalized weakness    Cervical / Trunk Assessment Cervical / Trunk Assessment: Normal  Communication   Communication: No difficulties  Cognition Arousal/Alertness: Awake/alert Behavior During Therapy: WFL for  tasks assessed/performed Overall Cognitive Status: Within Functional Limits for tasks assessed                                        General Comments      Exercises Total Joint Exercises Ankle Circles/Pumps: AROM;Both;10 reps Quad Sets:  AROM;Both;10 reps Heel Slides: AAROM;Left;10 reps Hip ABduction/ADduction: AAROM;Left;10 reps Straight Leg Raises: AAROM;Left;10 reps Goniometric ROM: ~10-50 degrees   Assessment/Plan    PT Assessment Patient needs continued PT services  PT Problem List Decreased strength;Decreased range of motion;Decreased mobility;Decreased activity tolerance;Decreased balance;Pain;Obesity       PT Treatment Interventions DME instruction;Gait training;Therapeutic exercise;Stair training;Functional mobility training;Therapeutic activities;Patient/family education;Balance training    PT Goals (Current goals can be found in the Care Plan section)  Acute Rehab PT Goals Patient Stated Goal: less pain. home. PT Goal Formulation: With patient/family Time For Goal Achievement: 10/15/20 Potential to Achieve Goals: Good    Frequency 7X/week   Barriers to discharge Decreased caregiver support husband will be there 24/7 but he can really only provide Min A 2* bad back    Co-evaluation               AM-PAC PT "6 Clicks" Mobility  Outcome Measure Help needed turning from your back to your side while in a flat bed without using bedrails?: A Lot Help needed moving from lying on your back to sitting on the side of a flat bed without using bedrails?: A Lot Help needed moving to and from a bed to a chair (including a wheelchair)?: A Lot Help needed standing up from a chair using your arms (e.g., wheelchair or bedside chair)?: A Lot Help needed to walk in hospital room?: A Lot Help needed climbing 3-5 steps with a railing? : Total 6 Click Score: 11    End of Session Equipment Utilized During Treatment: Gait belt Activity Tolerance: Patient limited by fatigue;Patient limited by pain Patient left: in chair;with call bell/phone within reach;with family/visitor present   PT Visit Diagnosis: Other abnormalities of gait and mobility (R26.89);Pain;Muscle weakness (generalized) (M62.81) Pain - Right/Left:  Left Pain - part of body: Knee    Time: 1135-1203 PT Time Calculation (min) (ACUTE ONLY): 28 min   Charges:   PT Evaluation $PT Eval Low Complexity: 1 Low PT Treatments $Gait Training: 8-22 mins           Doreatha Massed, PT Acute Rehabilitation  Office: [phone] Pager: [phone]

## 2020-10-01 NOTE — Progress Notes (Addendum)
   Subjective: 1 Day Post-Op Procedure(s) (LRB): TOTAL KNEE ARTHROPLASTY (Left) Patient reports pain as mild.   Patient seen in rounds by Dr. Wynelle Link. Patient is well, and has had no acute complaints or problems. Pain improved from yesterday. Denies chest pain, SOB, or calf pain. Voiding without difficulty.  We will begin therapy today.   Objective: Vital signs in last 24 hours: Temp:  [97.4 F (36.3 C)-98.2 F (36.8 C)] 97.6 F (36.4 C) (08/16 0605) Pulse Rate:  [68-88] 73 (08/16 0605) Resp:  [13-20] 18 (08/16 0605) BP: (112-189)/(55-100) 112/59 (08/16 0605) SpO2:  [92 %-100 %] 96 % (08/16 0605) Weight:  [100.6 kg] 100.6 kg (08/15 1050)  Intake/Output from previous day:  Intake/Output Summary (Last 24 hours) at 10/01/2020 0744 Last data filed at 10/01/2020 0600 Gross per 24 hour  Intake 4197.98 ml  Output 200 ml  Net 3997.98 ml     Intake/Output this shift: No intake/output data recorded.  Labs: Recent Labs    10/01/20 0303  HGB 11.2*   Recent Labs    10/01/20 0303  WBC 8.4  RBC 3.42*  HCT 34.0*  PLT 208   Recent Labs    10/01/20 0303  NA 135  K 4.0  CL 105  CO2 24  BUN 19  CREATININE 1.05*  GLUCOSE 124*  CALCIUM 8.2*   No results for input(s): LABPT, INR in the last 72 hours.  Exam: General - Patient is Alert and Oriented Extremity - Neurologically intact Neurovascular intact Sensation intact distally Dorsiflexion/Plantar flexion intact Dressing - dressing C/D/I Motor Function - intact, moving foot and toes well on exam.   Past Medical History:  Diagnosis Date   Arthritis    "knees, lower back, shoulders" (11/18/2017)   Breast cancer, left breast ([place]) 1991   Breast cancer, right breast ([place]) 2010   Dyspnea    with exertion   Frequency of urination    GERD (gastroesophageal reflux disease)    Gout    "on daily RX" (11/18/2017)   Heart murmur    Hypertension     Assessment/Plan: 1 Day Post-Op Procedure(s) (LRB): TOTAL KNEE  ARTHROPLASTY (Left) Principal Problem:   OA (osteoarthritis) of knee Active Problems:   Primary osteoarthritis of left knee  Estimated body mass index is 38.67 kg/m as calculated from the following:   Height as of this encounter: 5' 3.5" (1.613 m).   Weight as of this encounter: 100.6 kg. Advance diet Up with therapy D/C IV fluids   Patient's anticipated LOS is less than 2 midnights, meeting these requirements: - Younger than 47 - Lives within 1 hour of care - Has a competent adult at home to recover with post-op recover - NO history of  - Chronic pain requiring opiods  - Diabetes  - Coronary Artery Disease  - Heart failure  - Heart attack  - Stroke  - DVT/VTE  - Cardiac arrhythmia  - Respiratory Failure/COPD  - Renal failure  - Anemia  - Advanced Liver disease  DVT Prophylaxis - Xarelto Weight bearing as tolerated. Begin therapy.  Plan is to go Home after hospital stay. Possible discharge later today if progresses with therapy and meeting her goals. Scheduled [facility] OPP[facility] at [facility] Follow-up in the office in 2 weeks  The [place] was reviewed today prior to any opioid medications being prescribed to this patient.  [name], PA-C Orthopedic Surgery [phone] 10/01/2020, 7:44 AM

## 2020-10-01 NOTE — Plan of Care (Signed)

## 2020-10-01 NOTE — Progress Notes (Signed)
Physical Therapy Treatment Patient Details Name: Sandra Wise MRN: [id number] DOB: 07-19-1940 Today's Date: 10/01/2020    History of Present Illness 80 yo female s/p L TKA 09/30/20. Hx of L rev TSA 2019, breast ca, gout    PT Comments    Progressing slowly. Limited by pain, weakness, fatigue on today. Will continue to progress activity as tolerated. Pt's husband has some physical limitations so pt will need to be mobilizing fairly well before discharge.     Follow Up Recommendations  Follow surgeon's recommendation for DC plan and follow-up therapies;Supervision/Assistance - 24 hour     Equipment Recommendations  Rolling walker with 5" wheels    Recommendations for Other Services OT consult     Precautions / Restrictions Precautions Precautions: Fall;Knee Restrictions Weight Bearing Restrictions: No LLE Weight Bearing: Weight bearing as tolerated    Mobility  Bed Mobility Overal bed mobility: Needs Assistance Bed Mobility: Sit to Supine     Sit to supine: Mod assist;HOB elevated   General bed mobility comments: Assist for trunk and L LE. Increased time. Task is effortful for pt. Cues for safety, technique.     Transfers Overall transfer level: Needs assistance Equipment used: Rolling walker (2 wheeled) Transfers: Sit to/from Stand Sit to Stand: Mod assist;+2 physical assistance;+2 safety/equipment Stand pivot transfers: Min assist;+2 physical assistance;+2 safety/equipment       General transfer comment: x2. Assist to rise, steady, control descent. Cues for safety, technique, placement of hands/feet. Poor balance initially. Increased time.   Ambulation/Gait Ambulation/Gait assistance: Min assist;+2 physical assistance;+2 safety/equipment Gait Distance (Feet): 7 Feet Assistive device: Rolling walker (2 wheeled) Gait Pattern/deviations: Step-to pattern;Antalgic     General Gait Details: Walked short distance from recliner (across room) to the bed. Slow, antalgic  gait pattern. Cues for safety, technique, posture, RW proximity. Distance limited by pain, fatigue.   Stairs             Wheelchair Mobility    Modified Rankin (Stroke Patients Only)       Balance Overall balance assessment: Needs assistance         Standing balance support: Bilateral upper extremity supported Standing balance-Leahy Scale: Poor                              Cognition Arousal/Alertness: Awake/alert Behavior During Therapy: WFL for tasks assessed/performed Overall Cognitive Status: Within Functional Limits for tasks assessed                                        Exercises     General Comments        Pertinent Vitals/Pain Pain Assessment: 0-10 Pain Score: 7  Pain Location: L knee Pain Descriptors / Indicators: Discomfort;Sore;Aching;Sharp Pain Intervention(s): Limited activity within patient's tolerance;Monitored during session;Repositioned    Home Living                      Prior Function            PT Goals (current goals can now be found in the care plan section) Acute Rehab PT Goals Patient Stated Goal: less pain. home. PT Goal Formulation: With patient/family Time For Goal Achievement: 10/15/20 Potential to Achieve Goals: Good Progress towards PT goals: Progressing toward goals    Frequency    7X/week      PT Plan Current plan  remains appropriate    Co-evaluation              AM-PAC PT "6 Clicks" Mobility   Outcome Measure  Help needed turning from your back to your side while in a flat bed without using bedrails?: A Lot Help needed moving from lying on your back to sitting on the side of a flat bed without using bedrails?: A Lot Help needed moving to and from a bed to a chair (including a wheelchair)?: A Lot Help needed standing up from a chair using your arms (e.g., wheelchair or bedside chair)?: A Lot Help needed to walk in hospital room?: A Lot Help needed climbing 3-5  steps with a railing? : Total 6 Click Score: 11    End of Session Equipment Utilized During Treatment: Gait belt Activity Tolerance: Patient limited by fatigue;Patient limited by pain Patient left: in bed;with call bell/phone within reach;with bed alarm set;with family/visitor present   PT Visit Diagnosis: Other abnormalities of gait and mobility (R26.89);Pain;Muscle weakness (generalized) (M62.81) Pain - Right/Left: Left Pain - part of body: Knee     Time: [id number] PT Time Calculation (min) (ACUTE ONLY): 19 min  Charges:  $Gait Training: 8-22 mins                        Doreatha Massed, PT Acute Rehabilitation  Office: [phone] Pager: [phone]

## 2020-10-02 LAB — BASIC METABOLIC PANEL
Anion gap: 9 (ref 5–15)
BUN: 15 mg/dL (ref 8–23)
CO2: 25 mmol/L (ref 22–32)
Calcium: 8.9 mg/dL (ref 8.9–10.3)
Chloride: 105 mmol/L (ref 98–111)
Creatinine, Ser: 0.93 mg/dL (ref 0.44–1.00)
GFR, Estimated: >60 mL/min (ref >60)
Glucose, Bld: 113 mg/dL — ABNORMAL HIGH (ref 70–99)
Potassium: 4.1 mmol/L (ref 3.5–5.1)
Sodium: 139 mmol/L (ref 135–145)

## 2020-10-02 LAB — CBC
HCT: 36.6 % (ref 36.0–46.0)
Hemoglobin: 12.1 g/dL (ref 12.0–15.0)
MCH: 33 pg (ref 26.0–34.0)
MCHC: 33.1 g/dL (ref 30.0–36.0)
MCV: 99.7 fL (ref 80.0–100.0)
Platelets: 207 10*3/uL (ref 150–400)
RBC: 3.67 MIL/uL — ABNORMAL LOW (ref 3.87–5.11)
RDW: 12.9 % (ref 11.5–15.5)
WBC: 9.8 10*3/uL (ref 4.0–10.5)
nRBC: 0 % (ref 0.0–0.2)

## 2020-10-02 NOTE — Progress Notes (Signed)
   Subjective: 2 Days Post-Op Procedure(s) (LRB): TOTAL KNEE ARTHROPLASTY (Left) Patient reports pain as mild.   Patient seen in rounds by Dr. Wynelle Link. Patient is well, and has had no acute complaints or problems. Denies chest pain or SOB. Pain well controlled. Was not meeting goals with physical therapy yesterday to be discharged.  Plan is to go Home after hospital stay.  Objective: Vital signs in last 24 hours: Temp:  [97.8 F (36.6 C)-98.6 F (37 C)] 98.6 F (37 C) (08/17 0606) Pulse Rate:  [73-88] 85 (08/17 0606) Resp:  [16-18] 16 (08/17 0606) BP: (120-161)/(63-75) 148/72 (08/17 0606) SpO2:  [92 %-95 %] 94 % (08/17 0606)  Intake/Output from previous day:  Intake/Output Summary (Last 24 hours) at 10/02/2020 0755 Last data filed at 10/02/2020 0606 Gross per 24 hour  Intake 965.01 ml  Output 1740 ml  Net -774.99 ml    Intake/Output this shift: No intake/output data recorded.  Labs: Recent Labs    10/01/20 0303 10/02/20 0325  HGB 11.2* 12.1   Recent Labs    10/01/20 0303 10/02/20 0325  WBC 8.4 9.8  RBC 3.42* 3.67*  HCT 34.0* 36.6  PLT 208 207   Recent Labs    10/01/20 0303 10/02/20 0325  NA 135 139  K 4.0 4.1  CL 105 105  CO2 24 25  BUN 19 15  CREATININE 1.05* 0.93  GLUCOSE 124* 113*  CALCIUM 8.2* 8.9   No results for input(s): LABPT, INR in the last 72 hours.  Exam: General - Patient is Alert and Oriented Extremity - Neurologically intact Neurovascular intact Sensation intact distally Dorsiflexion/Plantar flexion intact Dressing/Incision - clean, dry, no drainage. Bulky dressing removed, aquacel in place. Motor Function - intact, moving foot and toes well on exam.   Past Medical History:  Diagnosis Date   Arthritis    "knees, lower back, shoulders" (11/18/2017)   Breast cancer, left breast ([place]) 1991   Breast cancer, right breast ([place]) 2010   Dyspnea    with exertion   Frequency of urination    GERD (gastroesophageal reflux disease)     Gout    "on daily RX" (11/18/2017)   Heart murmur    Hypertension     Assessment/Plan: 2 Days Post-Op Procedure(s) (LRB): TOTAL KNEE ARTHROPLASTY (Left) Principal Problem:   OA (osteoarthritis) of knee Active Problems:   Primary osteoarthritis of left knee  Estimated body mass index is 38.67 kg/m as calculated from the following:   Height as of this encounter: 5' 3.5" (1.613 m).   Weight as of this encounter: 100.6 kg. Up with therapy  DVT Prophylaxis - Xarelto Weight-bearing as tolerated  Plan for discharge to home once cleared by PT.  [name], PA-C Orthopedic Surgery [phone] 10/02/2020, 7:55 AM

## 2020-10-02 NOTE — Evaluation (Signed)
Occupational Therapy Evaluation Patient Details Name: Sandra Wise MRN: [id number] DOB: 02-Apr-1940 Today's Date: 10/02/2020    History of Present Illness 80 yo female s/p L TKA 09/30/20. Hx of L rev TSA 2019, breast ca, gout   Clinical Impression   Sandra Wise is an 80 year old woman s/p knee surgery who presents with decreased ROM and strength of left lower extremity, generalized weakness of upper extremities and decreased ability to perform ambulation and ADLs. Patient will benefit from skilled OT services while in hospital to improve deficits and learn compensatory strategies as needed in order to perform functional tasks.  Plan is for patient to return home at discharge.    Follow Up Recommendations  Home health OT    Equipment Recommendations  None recommended by OT    Recommendations for Other Services       Precautions / Restrictions Precautions Precautions: Fall;Knee Restrictions Weight Bearing Restrictions: No LLE Weight Bearing: Weight bearing as tolerated Other Position/Activity Restrictions: WBAT      Mobility Bed Mobility Overal bed mobility: Needs Assistance Bed Mobility: Supine to Sit     Supine to sit: Max assist;HOB elevated     General bed mobility comments: max assist to for LEs, pivoting of hip and trunk negotiation to side of bed.    Transfers Overall transfer level: Needs assistance Equipment used: Rolling walker (2 wheeled)   Sit to Stand: Mod assist;+2 physical assistance;+2 safety/equipment;From elevated surface Stand pivot transfers: Mod assist;+2 safety/equipment       General transfer comment: Mod assist to stand pivot with cues and assistance for walker management. Patient exhibiting difficulty weight shifting and required therapist to pivot hips part way through transfer.    Balance Overall balance assessment: Needs assistance Sitting-balance support: No upper extremity supported Sitting balance-Leahy Scale: Fair     Standing  balance support: Bilateral upper extremity supported Standing balance-Leahy Scale: Poor                             ADL either performed or assessed with clinical judgement   ADL Overall ADL's : Needs assistance/impaired Eating/Feeding: Independent   Grooming: Set up;Sitting   Upper Body Bathing: Set up;Sitting   Lower Body Bathing: Set up;Moderate assistance;Sit to/from stand   Upper Body Dressing : Set up;Sitting   Lower Body Dressing: Maximal assistance;Sit to/from stand   Toilet Transfer: Moderate assistance;BSC;+2 for safety/equipment;Stand-pivot;RW   Toileting- Clothing Manipulation and Hygiene: Maximal assistance;Sitting/lateral lean               Vision Patient Visual Report: No change from baseline       Perception     Praxis      Pertinent Vitals/Pain Pain Assessment: 0-10 Pain Score: 9  Pain Location: L knee (after mobility) Pain Descriptors / Indicators: Discomfort;Sore;Aching;Sharp Pain Intervention(s): Premedicated before session;Repositioned;Ice applied     Hand Dominance Right   Extremity/Trunk Assessment Upper Extremity Assessment Upper Extremity Assessment: RUE deficits/detail;LUE deficits/detail RUE Deficits / Details: WFL ROM, grossly 4-/5 strength RUE Sensation: WNL RUE Coordination: WNL LUE Deficits / Details: WFL ROM, decreased internal rotation secondary to rTSA, 4/5 strength LUE Sensation: WNL LUE Coordination: WNL   Lower Extremity Assessment Lower Extremity Assessment: Defer to PT evaluation   Cervical / Trunk Assessment Cervical / Trunk Assessment: Normal   Communication Communication Communication: No difficulties   Cognition Arousal/Alertness: Awake/alert Behavior During Therapy: WFL for tasks assessed/performed Overall Cognitive Status: Within Functional Limits for tasks assessed  General Comments       Exercises     Shoulder Instructions       Home Living Family/patient expects to be discharged to:: Private residence Living Arrangements: Spouse/significant other Available Help at Discharge: Family Type of Home: House Home Access: [place]: Able to live on main level with bedroom/bathroom     Bathroom Shower/Tub: Occupational psychologist: Standard     Home Equipment: Environmental consultant - 2 wheels;Cane - single point;Toilet riser   Additional Comments: 1 step to get in to bathroom.      Prior Functioning/Environment Level of Independence: Needs assistance    ADL's / Homemaking Assistance Needed: needed some assistance for LB dressing and bra            OT Problem List: Decreased strength;Decreased range of motion;Decreased activity tolerance;Impaired balance (sitting and/or standing);Decreased knowledge of use of DME or AE;Pain;Obesity      OT Treatment/Interventions: Self-care/ADL training;Therapeutic exercise;DME and/or AE instruction;Therapeutic activities;Balance training;Patient/family education    OT Goals(Current goals can be found in the care plan section) Acute Rehab OT Goals Patient Stated Goal: improve functional abilities OT Goal Formulation: With patient Time For Goal Achievement: 10/16/20 Potential to Achieve Goals: Good  OT Frequency: Min 2X/week   Barriers to D/C:            Co-evaluation              AM-PAC OT "6 Clicks" Daily Activity     Outcome Measure Help from another person eating meals?: None Help from another person taking care of personal grooming?: A Little Help from another person toileting, which includes using toliet, bedpan, or urinal?: A Lot Help from another person bathing (including washing, rinsing, drying)?: A Lot Help from another person to put on and taking off regular upper body clothing?: A Little Help from another person to put on and taking off regular lower body clothing?: A Lot 6 Click Score: 16   End of Session Equipment  Utilized During Treatment: Rolling walker Nurse Communication: Mobility status  Activity Tolerance: Patient limited by pain Patient left: in chair;with call bell/phone within reach;with family/visitor present;with chair alarm set  OT Visit Diagnosis: Pain;Other abnormalities of gait and mobility (R26.89) Pain - Right/Left: Left Pain - part of body: Knee                Time: [id number] OT Time Calculation (min): 20 min Charges:  OT General Charges $OT Visit: 1 Visit OT Evaluation $OT Eval Low Complexity: 1 Low  Autymn Omlor, OTR/L [place]  Office [phone] Pager: [phone] 10/02/2020, 9:06 AM

## 2020-10-02 NOTE — Progress Notes (Signed)
Physical Therapy Treatment Patient Details Name: Sandra Wise MRN: [id number] DOB: 1940/03/06 Today's Date: 10/02/2020       PT Comments    PT session focused on LE therapeutic exercise for promotion of improved LE strength/ROM, and circulation. PT reviewed handout, pt demonstrated understanding of there ex with verbal and tactile cues provided for proper technique. Pt will benefit from continued skilled PT to increase independence and maximize safety with mobility.      10/02/20 1653  PT Visit Information  Last PT Received On 10/02/20  Assistance Needed +2  History of Present Illness 80 yo female s/p L TKA 09/30/20. Hx of L rev TSA 2019, breast ca, gout  Subjective Data  Subjective "im glad you came back"  Patient Stated Goal improve functional abilities  Precautions  Precautions Fall  Restrictions  Weight Bearing Restrictions No  LLE Weight Bearing WBAT  Pain Assessment  Pain Assessment Faces  Faces Pain Scale 6  Pain Descriptors / Indicators Discomfort;Aching;Sharp  Pain Intervention(s) Limited activity within patient's tolerance;Monitored during session;Repositioned;Ice applied  Cognition  Arousal/Alertness Awake/alert  Behavior During Therapy WFL for tasks assessed/performed  Overall Cognitive Status Within Functional Limits for tasks assessed  Ambulation/Gait  General Gait Details Pt politely deferring ambulation again this afternoon, supine ther ex performed  Exercises  Exercises Total Joint  Total Joint Exercises  Ankle Circles/Pumps AROM;Both;20 reps;Supine  Quad Sets AROM;Both;Supine;10 reps  Heel Slides AAROM;Left;10 reps;Supine  Hip ABduction/ADduction AROM;Left;10 reps;Supine  Straight Leg Raises AAROM;Left;10 reps;Supine (limited clearance from bed)  Short Arc Quad AROM;Left;10 reps;Supine (limited clearance)  PT - End of Session  Equipment Utilized During Treatment  (Gait belt used for heel slides for ther ex)  Activity Tolerance Patient tolerated  treatment well  Patient left in bed;with call bell/phone within reach;with bed alarm set;with nursing/sitter in room  Nurse Communication Mobility status   PT - Assessment/Plan  PT Plan Current plan remains appropriate  PT Visit Diagnosis Pain;Muscle weakness (generalized) (M62.81);Other abnormalities of gait and mobility (R26.89)  Pain - Right/Left Left  Pain - part of body Knee  PT Frequency (ACUTE ONLY) 7X/week  Follow Up Recommendations Follow surgeon's recommendation for DC plan and follow-up therapies;Supervision/Assistance - 24 hour  PT equipment Rolling walker with 5" wheels  AM-PAC PT "6 Clicks" Mobility Outcome Measure (Version 2)  Help needed turning from your back to your side while in a flat bed without using bedrails? 2  Help needed moving from lying on your back to sitting on the side of a flat bed without using bedrails? 2  Help needed moving to and from a bed to a chair (including a wheelchair)? 2  Help needed standing up from a chair using your arms (e.g., wheelchair or bedside chair)? 2  Help needed to walk in hospital room? 2  Help needed climbing 3-5 steps with a railing?  1  6 Click Score 11  Consider Recommendation of Discharge To: CIR/SNF/LTACH  Progressive Mobility  What is the highest level of mobility based on the progressive mobility assessment? Level 4 (Walks with assist in room) - Balance while marching in place and cannot step forward and back - Complete  Mobility Ambulated with assistance in room  PT Goal Progression  Progress towards PT goals Progressing toward goals  Acute Rehab PT Goals  PT Goal Formulation With patient/family  Time For Goal Achievement 10/15/20  Potential to Achieve Goals Good  PT Time Calculation  PT Start Time (ACUTE ONLY) 1627  PT Stop Time (ACUTE ONLY) 1650  PT  Time Calculation (min) (ACUTE ONLY) 23 min  PT General Charges  $$ ACUTE PT VISIT 1 Visit  PT Treatments  $Therapeutic Exercise 23-37 mins       Festus Barren PT, DPT   Acute Rehabilitation Services  Office [phone]  10/02/2020, 5:05 PM

## 2020-10-02 NOTE — Progress Notes (Signed)
Physical Therapy Treatment Patient Details Name: Sandra Wise MRN: [id number] DOB: 07/02/1940 Today's Date: 10/02/2020    History of Present Illness 80 yo female s/p L TKA 09/30/20. Hx of L rev TSA 2019, breast ca, gout    PT Comments    Pt is making slow progress toward acute PT goals with increased ambulation distance this session. Pt continues to demonstrate decreased activity tolerance, LE weakness, and increased pain levels requiring +2 assist for performance and safety with all mobility. Pt is currently not at safe mobility level for d/c home. Pt will benefit from continued skilled PT to increase their independence and maximize safety with mobility.     Follow Up Recommendations  Follow surgeon's recommendation for DC plan and follow-up therapies;Supervision/Assistance - 24 hour     Equipment Recommendations  Rolling walker with 5" wheels    Recommendations for Other Services       Precautions / Restrictions Precautions Precautions: Fall Restrictions Weight Bearing Restrictions: No LLE Weight Bearing: Weight bearing as tolerated    Mobility  Bed Mobility Overal bed mobility: Needs Assistance Bed Mobility: Sit to Supine       Sit to supine: Min assist;+2 for safety/equipment;HOB elevated   General bed mobility comments: Pt OOB in recliner upon entry. MIN A +2 for controlled descent of trunk and assist with progression of Lt LE onto bed    Transfers Overall transfer level: Needs assistance Equipment used: Rolling walker (2 wheeled) Transfers: Sit to/from Stand Sit to Stand: Mod assist;+2 physical assistance;+2 safety/equipment         General transfer comment: Attempted sit to stand x3 from recliner with +1 assist from PT, unable to achieve. Mod assist+ 2 required for power up to stand with cues for hand placement and RW stability. Pt with posterior lean in standing, requiring cues for upright posture and to bring LEs underneath her for  correction  Ambulation/Gait Ambulation/Gait assistance: Mod assist;Min assist;+2 physical assistance;+2 safety/equipment Gait Distance (Feet): 12 Feet Assistive device: Rolling walker (2 wheeled) Gait Pattern/deviations: Step-to pattern;Decreased stride length;Shuffle;Decreased weight shift to left Gait velocity: decr   General Gait Details: MOD A +2 progressing to MIN A +2 to ambulate from recliner across room to counter. Pt initally requiring manual facilitation to move RW forward and for facilitation of lateral weight shift bilaterally and for progression of Lt foot. Pt with improved progression of forward steps requiring decreased assist +2. Pt ambulated ~51f with x2 standing rest breaks.   Stairs             Wheelchair Mobility    Modified Rankin (Stroke Patients Only)       Balance Overall balance assessment: Needs assistance Sitting-balance support: Feet supported Sitting balance-Leahy Scale: Fair     Standing balance support: Bilateral upper extremity supported Standing balance-Leahy Scale: Poor Standing balance comment: use of external support                            Cognition Arousal/Alertness: Awake/alert Behavior During Therapy: WFL for tasks assessed/performed Overall Cognitive Status: Within Functional Limits for tasks assessed                                        Exercises      General Comments        Pertinent Vitals/Pain Pain Assessment: 0-10 Pain Score: 8  Pain Location: up  to 8.5/10, L knee Pain Descriptors / Indicators: Discomfort;Sore;Aching;Sharp Pain Intervention(s): Limited activity within patient's tolerance;Monitored during session;Repositioned;Ice applied    Home Living                      Prior Function            PT Goals (current goals can now be found in the care plan section) Acute Rehab PT Goals Patient Stated Goal: improve functional abilities PT Goal Formulation: With  patient/family Time For Goal Achievement: 10/15/20 Potential to Achieve Goals: Good Progress towards PT goals: Progressing toward goals    Frequency    7X/week      PT Plan Current plan remains appropriate    Co-evaluation              AM-PAC PT "6 Clicks" Mobility   Outcome Measure  Help needed turning from your back to your side while in a flat bed without using bedrails?: A Lot Help needed moving from lying on your back to sitting on the side of a flat bed without using bedrails?: A Lot Help needed moving to and from a bed to a chair (including a wheelchair)?: A Lot Help needed standing up from a chair using your arms (e.g., wheelchair or bedside chair)?: A Lot Help needed to walk in hospital room?: A Lot Help needed climbing 3-5 steps with a railing? : Total 6 Click Score: 11    End of Session Equipment Utilized During Treatment: Gait belt Activity Tolerance: Patient limited by fatigue;Patient limited by pain Patient left: in bed;with call bell/phone within reach;with bed alarm set;with family/visitor present Nurse Communication: Mobility status PT Visit Diagnosis: Pain;Muscle weakness (generalized) (M62.81);Other abnormalities of gait and mobility (R26.89) Pain - Right/Left: Left Pain - part of body: Knee     Time: [id number] PT Time Calculation (min) (ACUTE ONLY): 30 min  Charges:  $Therapeutic Activity: 23-37 mins                     Festus Barren PT, DPT  Acute Rehabilitation Services  Office [phone]   10/02/2020, 4:34 PM

## 2020-10-03 LAB — CBC
HCT: 30.1 % — ABNORMAL LOW (ref 36.0–46.0)
Hemoglobin: 10 g/dL — ABNORMAL LOW (ref 12.0–15.0)
MCH: 32.7 pg (ref 26.0–34.0)
MCHC: 33.2 g/dL (ref 30.0–36.0)
MCV: 98.4 fL (ref 80.0–100.0)
Platelets: 182 10*3/uL (ref 150–400)
RBC: 3.06 MIL/uL — ABNORMAL LOW (ref 3.87–5.11)
RDW: 13.2 % (ref 11.5–15.5)
WBC: 9.3 10*3/uL (ref 4.0–10.5)
nRBC: 0 % (ref 0.0–0.2)

## 2020-10-03 MED ORDER — BISACODYL 10 MG RE SUPP
10.0000 mg | Freq: Once | RECTAL | Status: AC
  Administered 2020-10-03: 10 mg via RECTAL
  Filled 2020-10-03: qty 1

## 2020-10-03 NOTE — Progress Notes (Signed)
   Subjective: 3 Days Post-Op Procedure(s) (LRB): TOTAL KNEE ARTHROPLASTY (Left) Patient reports pain as mild.   Patient seen in rounds with Dr. Wynelle Link. Patient is well, and has had no acute complaints or problems. Has had slower progression with physical therapy, prompting longer hospital course. No issues overnight. Has not had bowel movement since surgery, reports her stomach "feels full." Will order a suppository to be given this AM. Denies abdominal pain, SOB, or chest pain.  Plan is to go Home after hospital stay.  Objective: Vital signs in last 24 hours: Temp:  [97.5 F (36.4 C)-98.9 F (37.2 C)] 97.5 F (36.4 C) (08/18 0547) Pulse Rate:  [72-88] 72 (08/18 0547) Resp:  [16-17] 17 (08/18 0547) BP: (108-137)/(51-65) 112/65 (08/18 0547) SpO2:  [93 %-95 %] 95 % (08/18 0547)  Intake/Output from previous day:  Intake/Output Summary (Last 24 hours) at 10/03/2020 0757 Last data filed at 10/03/2020 [id number] Gross per 24 hour  Intake 660 ml  Output 750 ml  Net -90 ml    Intake/Output this shift: No intake/output data recorded.  Labs: Recent Labs    10/01/20 0303 10/02/20 0325 10/03/20 0345  HGB 11.2* 12.1 10.0*   Recent Labs    10/02/20 0325 10/03/20 0345  WBC 9.8 9.3  RBC 3.67* 3.06*  HCT 36.6 30.1*  PLT 207 182   Recent Labs    10/01/20 0303 10/02/20 0325  NA 135 139  K 4.0 4.1  CL 105 105  CO2 24 25  BUN 19 15  CREATININE 1.05* 0.93  GLUCOSE 124* 113*  CALCIUM 8.2* 8.9   No results for input(s): LABPT, INR in the last 72 hours.  Exam: General - Patient is Alert and Oriented Extremity - Neurologically intact Neurovascular intact Sensation intact distally Dorsiflexion/Plantar flexion intact Dressing/Incision - clean, dry, no drainage Motor Function - intact, moving foot and toes well on exam.   Past Medical History:  Diagnosis Date   Arthritis    "knees, lower back, shoulders" (11/18/2017)   Breast cancer, left breast ([place]) 1991   Breast cancer,  right breast ([place]) 2010   Dyspnea    with exertion   Frequency of urination    GERD (gastroesophageal reflux disease)    Gout    "on daily RX" (11/18/2017)   Heart murmur    Hypertension     Assessment/Plan: 3 Days Post-Op Procedure(s) (LRB): TOTAL KNEE ARTHROPLASTY (Left) Principal Problem:   OA (osteoarthritis) of knee Active Problems:   Primary osteoarthritis of left knee  Estimated body mass index is 38.67 kg/m as calculated from the following:   Height as of this encounter: 5' 3.5" (1.613 m).   Weight as of this encounter: 100.6 kg. Up with therapy  DVT Prophylaxis - Xarelto Weight-bearing as tolerated  Plan for discharge to home once cleared by PT.  Sandra Duty, PA-C Orthopedic Surgery [phone] 10/03/2020, 7:57 AM

## 2020-10-03 NOTE — Progress Notes (Signed)
Physical Therapy Treatment Patient Details Name: Sandra Wise MRN: [id number] DOB: 1940-12-03 Today's Date: 10/03/2020    History of Present Illness 80 yo female s/p L TKA 09/30/20. Hx of L rev TSA 2019, breast ca, gout    PT Comments    Pt continues to require at least mod assist +2 for safe mobility.  Pt has an 88 year old husband at home who would not be able to provide this assist.  Pt would benefit from SNF upon d/c however current plan per orthopaedics is for home. Pt is not ready for d/c today if home.   Follow Up Recommendations  Follow surgeon's recommendation for DC plan and follow-up therapies;Supervision/Assistance - 24 hour     Equipment Recommendations  Rolling walker with 5" wheels    Recommendations for Other Services       Precautions / Restrictions Precautions Precautions: Fall;Knee Required Braces or Orthoses: Knee Immobilizer - Left Knee Immobilizer - Left: Discontinue once straight leg raise with < 10 degree lag Restrictions Weight Bearing Restrictions: Yes LLE Weight Bearing: Weight bearing as tolerated Other Position/Activity Restrictions: WBAT    Mobility  Bed Mobility Overal bed mobility: Needs Assistance Bed Mobility: Supine to Sit     Supine to sit: Max assist     General bed mobility comments: pt provided with gait belt to initiate self assist of Lt LE, pt able to bring legs over to EOB however required assist for lowering Lt LE to floor, trunk upright, and scooting to EOB    Transfers Overall transfer level: Needs assistance Equipment used: Rolling walker (2 wheeled) Transfers: Sit to/from Stand Sit to Stand: Mod assist;+2 physical assistance;+2 safety/equipment         General transfer comment: multimodal cues for UE and LE positioning and weight shift, pt requiring assist to rise and steady, posterior lean with standing and UEs not pushing down through RW requiring cues to correct  Ambulation/Gait Ambulation/Gait assistance: Min  assist;Mod assist;+2 safety/equipment Gait Distance (Feet): 12 Feet Assistive device: Rolling walker (2 wheeled) Gait Pattern/deviations: Step-to pattern;Decreased weight shift to left;Antalgic Gait velocity: decr   General Gait Details: verbal cues for sequence, step length, posture, improved from mod assist to min assist and recliner following for safety as pt fatigues quickly   Stairs             Wheelchair Mobility    Modified Rankin (Stroke Patients Only)       Balance                                            Cognition Arousal/Alertness: Awake/alert Behavior During Therapy: WFL for tasks assessed/performed Overall Cognitive Status: Within Functional Limits for tasks assessed                                        Exercises      General Comments        Pertinent Vitals/Pain Pain Assessment: 0-10 Pain Score: 6  Pain Location: left knee Pain Descriptors / Indicators: Discomfort;Aching;Sore Pain Intervention(s): Repositioned;Monitored during session    Home Living                      Prior Function            PT Goals (current  goals can now be found in the care plan section) Progress towards PT goals: Progressing toward goals    Frequency    7X/week      PT Plan Current plan remains appropriate    Co-evaluation              AM-PAC PT "6 Clicks" Mobility   Outcome Measure  Help needed turning from your back to your side while in a flat bed without using bedrails?: A Lot Help needed moving from lying on your back to sitting on the side of a flat bed without using bedrails?: A Lot Help needed moving to and from a bed to a chair (including a wheelchair)?: A Lot Help needed standing up from a chair using your arms (e.g., wheelchair or bedside chair)?: A Lot Help needed to walk in hospital room?: A Lot Help needed climbing 3-5 steps with a railing? : Total 6 Click Score: 11    End of  Session Equipment Utilized During Treatment: Gait belt Activity Tolerance: Patient tolerated treatment well;Patient limited by fatigue Patient left: with call bell/phone within reach;in chair;with chair alarm set Nurse Communication: Mobility status PT Visit Diagnosis: Pain;Muscle weakness (generalized) (M62.81);Other abnormalities of gait and mobility (R26.89) Pain - Right/Left: Left Pain - part of body: Knee     Time: [id number] PT Time Calculation (min) (ACUTE ONLY): 19 min  Charges:  $Gait Training: 8-22 mins                     Arlyce Dice, DPT Acute Rehabilitation Services Pager: [phone] Office: [phone]    York Ram E 10/03/2020, 12:52 PM

## 2020-10-03 NOTE — Plan of Care (Signed)

## 2020-10-03 NOTE — Progress Notes (Signed)
Physical Therapy Treatment Patient Details Name: Sandra Wise MRN: [id number] DOB: April 18, 1940 Today's Date: 10/03/2020    History of Present Illness 80 yo female s/p L TKA 09/30/20. Hx of L rev TSA 2019, breast ca, gout    PT Comments    Pt assisted with ambulating again this afternoon however similar performance and assist required as this morning.  Pt able to improve ambulation distance however very short distance to begin with.  Pt continues to require significant assist for bed mobility and transfers as well.  Continue to recommend SNF upon d/c at this time.    Follow Up Recommendations  Follow surgeon's recommendation for DC plan and follow-up therapies;Supervision/Assistance - 24 hour     Equipment Recommendations  Rolling walker with 5" wheels    Recommendations for Other Services       Precautions / Restrictions Precautions Precautions: Fall;Knee Required Braces or Orthoses: Knee Immobilizer - Left Knee Immobilizer - Left: Discontinue once straight leg raise with < 10 degree lag Restrictions Weight Bearing Restrictions: No LLE Weight Bearing: Weight bearing as tolerated    Mobility  Bed Mobility Overal bed mobility: Needs Assistance Bed Mobility: Sit to Supine       Sit to supine: Mod assist   General bed mobility comments: pt unable to self assist Lt LE, required assist for bil LEs onto bed    Transfers Overall transfer level: Needs assistance Equipment used: Rolling walker (2 wheeled) Transfers: Sit to/from Stand Sit to Stand: Mod assist;+2 physical assistance;+2 safety/equipment         General transfer comment: multimodal cues for UE and LE positioning and weight shift, pt requiring assist to rise and steady, posterior lean with standing and UEs not pushing down through RW requiring cues to correct  Ambulation/Gait Ambulation/Gait assistance: Min assist;Mod assist;+2 safety/equipment Gait Distance (Feet): 18 Feet Assistive device: Rolling walker (2  wheeled) Gait Pattern/deviations: Step-to pattern;Decreased weight shift to left;Antalgic Gait velocity: decr   General Gait Details: verbal cues for sequence, step length, posture, improved from mod assist to min assist and recliner following for safety as pt fatigues quickly   Stairs             Wheelchair Mobility    Modified Rankin (Stroke Patients Only)       Balance                                            Cognition Arousal/Alertness: Awake/alert Behavior During Therapy: WFL for tasks assessed/performed Overall Cognitive Status: Within Functional Limits for tasks assessed                                        Exercises      General Comments        Pertinent Vitals/Pain Pain Assessment: 0-10 Pain Score: 5  Pain Location: left knee Pain Descriptors / Indicators: Discomfort;Aching;Sore Pain Intervention(s): Repositioned;Monitored during session    Home Living                      Prior Function            PT Goals (current goals can now be found in the care plan section) Acute Rehab PT Goals Patient Stated Goal: improve functional abilities Progress towards PT goals: Progressing toward  goals    Frequency    7X/week      PT Plan Current plan remains appropriate    Co-evaluation              AM-PAC PT "6 Clicks" Mobility   Outcome Measure  Help needed turning from your back to your side while in a flat bed without using bedrails?: A Lot Help needed moving from lying on your back to sitting on the side of a flat bed without using bedrails?: A Lot Help needed moving to and from a bed to a chair (including a wheelchair)?: A Lot Help needed standing up from a chair using your arms (e.g., wheelchair or bedside chair)?: A Lot Help needed to walk in hospital room?: A Lot Help needed climbing 3-5 steps with a railing? : Total 6 Click Score: 11    End of Session Equipment Utilized During  Treatment: Gait belt Activity Tolerance: Patient tolerated treatment well;Patient limited by fatigue Patient left: with call bell/phone within reach;in bed;with bed alarm set;with SCD's reapplied Nurse Communication: Mobility status PT Visit Diagnosis: Pain;Muscle weakness (generalized) (M62.81);Other abnormalities of gait and mobility (R26.89) Pain - Right/Left: Left Pain - part of body: Knee     Time: 1345-1403 PT Time Calculation (min) (ACUTE ONLY): 18 min  Charges:  $Gait Training: 8-22 mins                    Arlyce Dice, DPT Acute Rehabilitation Services Pager: [phone] Office: [phone]   York Ram E 10/03/2020, 4:45 PM

## 2020-10-03 NOTE — Progress Notes (Signed)
Occupational Therapy Treatment Patient Details Name: Sandra Wise MRN: [id number] DOB: [birth year] Today's Date: 10/03/2020    History of present illness 80 yo female s/p L TKA 09/30/20. Hx of L rev TSA 2019, breast ca, gout   OT comments  Treatment focused on introducing patient to AE to improve independence with LB dressing. With attempts to perform sit to stand from recliner height - patient unable to power up with assistance of one. Patient reports having used a lift chair at home. Due to patient requiring +2 assistance to stand and significant difficulty with ADLs currently - therapist recommends short term rehab at discharge.    Follow Up Recommendations  SNF    Equipment Recommendations  None recommended by OT    Recommendations for Other Services      Precautions / Restrictions Precautions Precautions: Fall;Knee Required Braces or Orthoses: Knee Immobilizer - Left Knee Immobilizer - Left: Discontinue once straight leg raise with < 10 degree lag Restrictions Weight Bearing Restrictions: No LLE Weight Bearing: Weight bearing as tolerated Other Position/Activity Restrictions: WBAT       Mobility Bed Mobility Overal bed mobility: Needs Assistance Bed Mobility: Supine to Sit     Supine to sit: Max assist     General bed mobility comments: pt provided with gait belt to initiate self assist of Lt LE, pt able to bring legs over to EOB however required assist for lowering Lt LE to floor, trunk upright, and scooting to EOB    Transfers Overall transfer level: Needs assistance Equipment used: Rolling walker (2 wheeled) Transfers: Sit to/from Stand Sit to Stand: Mod assist;+2 physical assistance;+2 safety/equipment         General transfer comment: multimodal cues for UE and LE positioning and weight shift, pt requiring assist to rise and steady, posterior lean with standing and UEs not pushing down through RW requiring cues to correct    Balance                                            ADL either performed or assessed with clinical judgement   ADL Overall ADL's : Needs assistance/impaired                     Lower Body Dressing: Maximal assistance;Sitting/lateral leans Lower Body Dressing Details (indicate cue type and reason): Patient introduced to ADL kit to assist with increasing independence with LB dressing. Patient needed assistance to use LHR to dof socks and grossly able to don socks with sock aide. Patient grossly able to don underwear with reacher though limtied by grip socks and knee immobilizer. Unable to stand to work on [employer] up clothing.                     Vision Patient Visual Report: No change from baseline     Perception     Praxis      Cognition Arousal/Alertness: Awake/alert Behavior During Therapy: WFL for tasks assessed/performed Overall Cognitive Status: Within Functional Limits for tasks assessed                                          Exercises     Shoulder Instructions       General Comments      Pertinent  Vitals/ Pain       Pain Assessment: 0-10 Pain Score: 7  Pain Location: left knee Pain Descriptors / Indicators: Discomfort;Aching;Sore Pain Intervention(s): Limited activity within patient's tolerance;Premedicated before session  Home Living                                          Prior Functioning/Environment              Frequency  Min 2X/week        Progress Toward Goals  OT Goals(current goals can now be found in the care plan section)  Progress towards OT goals: Progressing toward goals  Acute Rehab OT Goals Patient Stated Goal: improve functional abilities OT Goal Formulation: With patient Time For Goal Achievement: 10/16/20 Potential to Achieve Goals: Good  Plan Discharge plan needs to be updated    Co-evaluation                 AM-PAC OT "6 Clicks" Daily Activity     Outcome Measure    Help from another person eating meals?: None Help from another person taking care of personal grooming?: A Little Help from another person toileting, which includes using toliet, bedpan, or urinal?: A Lot Help from another person bathing (including washing, rinsing, drying)?: A Lot Help from another person to put on and taking off regular upper body clothing?: A Little Help from another person to put on and taking off regular lower body clothing?: A Lot 6 Click Score: 16    End of Session CPM Left Knee CPM Left Knee: Off  OT Visit Diagnosis: Pain;Other abnormalities of gait and mobility (R26.89) Pain - Right/Left: Left Pain - part of body: Knee   Activity Tolerance Patient limited by pain   Patient Left in chair;with call bell/phone within reach;with family/visitor present;with chair alarm set   Nurse Communication          Time: [phone] OT Time Calculation (min): 20 min  Charges: OT General Charges $OT Visit: 1 Visit OT Treatments $Self Care/Home Management : 8-22 mins  Derl Barrow, OTR/L [place]  Office [phone] Pager: [place] 10/03/2020, 1:17 PM

## 2020-10-03 NOTE — Progress Notes (Signed)
Orthopedic Tech Progress Note Patient Details:  Sandra Wise 08/18/1940 [id number]  Patient ID: Sandra Wise, female   DOB: 11/18/1940, 80 y.o.   MRN: [id number]  Sandra Wise 10/03/2020, 12:42 PMPickup CPM

## 2020-10-04 ENCOUNTER — Other Ambulatory Visit (HOSPITAL_COMMUNITY): Payer: Self-pay

## 2020-10-04 LAB — RESP PANEL BY RT-PCR (FLU A&B, COVID) ARPGX2
Influenza A by PCR: NEGATIVE
Influenza B by PCR: NEGATIVE
SARS Coronavirus 2 by RT PCR: NEGATIVE

## 2020-10-04 MED ORDER — BISACODYL 10 MG RE SUPP
10.0000 mg | Freq: Once | RECTAL | Status: AC
  Administered 2020-10-04: 10 mg via RECTAL
  Filled 2020-10-04: qty 1

## 2020-10-04 MED ORDER — METHOCARBAMOL 500 MG PO TABS: 500.0000 mg | ORAL_TABLET | Freq: Four times a day (QID) | ORAL | 0 refills | Status: AC | PRN

## 2020-10-04 MED ORDER — RIVAROXABAN 10 MG PO TABS: 10.0000 mg | ORAL_TABLET | Freq: Every day | ORAL | 0 refills | Status: AC

## 2020-10-04 MED ORDER — OXYCODONE HCL 5 MG PO TABS: 5.0000 mg | ORAL_TABLET | Freq: Four times a day (QID) | ORAL | 0 refills | Status: AC | PRN

## 2020-10-04 MED ORDER — TRAMADOL HCL 50 MG PO TABS: 50.0000 mg | ORAL_TABLET | Freq: Four times a day (QID) | ORAL | 0 refills | Status: AC | PRN

## 2020-10-04 NOTE — NC FL2 (Signed)
[place] LEVEL OF CARE SCREENING TOOL     IDENTIFICATION  Patient Name: Sandra Wise Birthdate: 1940/04/28 Sex: female Admission Date (Current Location): 09/30/2020  [facility] and [place] Number:  Herbalist and Address:  [facility],  [place], [place]      Provider Number: [id number]  Attending Physician Name and Address:  Gaynelle Arabian, MD  Relative Name and Phone Number:  Mahasin Dolney (spouse) Ph: [phone]    Current Level of Care: Hospital Recommended Level of Care: [place] Prior Approval Number:    Date Approved/Denied:   PASRR Number: Pending  Discharge Plan: SNF    Current Diagnoses: Patient Active Problem List   Diagnosis Date Noted   OA (osteoarthritis) of knee 09/30/2020   Primary osteoarthritis of left knee 09/30/2020   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    Orientation RESPIRATION BLADDER Height & Weight     Self, Time, Situation, Place  Normal Continent Weight: 221 lb 12.8 oz (100.6 kg) Height:  5' 3.5" (161.3 cm)  BEHAVIORAL SYMPTOMS/MOOD NEUROLOGICAL BOWEL NUTRITION STATUS      Continent Diet (Regular diet)  AMBULATORY STATUS COMMUNICATION OF NEEDS Skin   Extensive Assist Verbally Surgical wounds, Other (Comment) (Blisters: back)                       Personal Care Assistance Level of Assistance  Bathing, Feeding, Dressing Bathing Assistance: Limited assistance Feeding assistance: Independent Dressing Assistance: Limited assistance     Functional Limitations Info  Sight, Hearing, Speech Sight Info: Adequate Hearing Info: Adequate Speech Info: Adequate    SPECIAL CARE FACTORS FREQUENCY  PT (By licensed PT), OT (By licensed OT)     PT Frequency: 5x's/week OT Frequency: 5x's/week            Contractures Contractures Info: Not present    Additional Factors Info  Code Status, Allergies Code Status Info: Full Allergies Info: Arimidex  (Anastrozole), Cortisone, Macrobid (Nitrofurantoin Macrocrystal), Other, Prednisone           Current Medications (10/04/2020):  This is the current hospital active medication list Current Facility-Administered Medications  Medication Dose Route Frequency Provider Last Rate Last Admin   0.9 %  sodium chloride infusion   Intravenous Continuous Edmisten, Kristie L, PA   Stopped at 10/01/20 0819   acetaminophen (TYLENOL) tablet 325-650 mg  325-650 mg Oral Q6H PRN Derl Barrow, PA   650 mg at 10/02/20 2023   allopurinol (ZYLOPRIM) tablet 100 mg  100 mg Oral Daily Edmisten, Kristie L, PA   100 mg at 10/04/20 0805   bisacodyl (DULCOLAX) suppository 10 mg  10 mg Rectal Once Edmisten, Kristie L, PA       diphenhydrAMINE (BENADRYL) 12.5 MG/5ML elixir 12.5-25 mg  12.5-25 mg Oral Q4H PRN Edmisten, Kristie L, PA       docusate sodium (COLACE) capsule 100 mg  100 mg Oral BID Edmisten, Kristie L, PA   100 mg at 10/04/20 0805   doxepin (SINEQUAN) capsule 10 mg  10 mg Oral QHS Edmisten, Kristie L, PA   10 mg at 10/03/20 2147   latanoprost (XALATAN) 0.005 % ophthalmic solution 1 drop  1 drop Both Eyes QHS Edmisten, Kristie L, PA   1 drop at 10/03/20 2149   menthol-cetylpyridinium (CEPACOL) lozenge 3 mg  1 lozenge Oral PRN Edmisten, Kristie L, PA   3 mg at 09/30/20 1709   Or   phenol (CHLORASEPTIC) mouth spray  1 spray  1 spray Mouth/Throat PRN Edmisten, Kristie L, PA       methocarbamol (ROBAXIN) tablet 500 mg  500 mg Oral Q6H PRN Edmisten, Kristie L, PA   500 mg at 10/03/20 2149   Or   methocarbamol (ROBAXIN) 500 mg in dextrose 5 % 50 mL IVPB  500 mg Intravenous Q6H PRN Edmisten, Kristie L, PA   Stopping Infusion hung by another clincian at 09/30/20 1602   metoCLOPramide (REGLAN) tablet 5-10 mg  5-10 mg Oral Q8H PRN Edmisten, Kristie L, PA       Or   metoCLOPramide (REGLAN) injection 5-10 mg  5-10 mg Intravenous Q8H PRN Edmisten, Kristie L, PA       mirabegron ER (MYRBETRIQ) tablet 25 mg  25 mg Oral  Daily Edmisten, Kristie L, PA   25 mg at 10/04/20 0805   morphine 4 MG/ML injection 1-2 mg  1-2 mg Intravenous Q2H PRN Edmisten, Kristie L, PA   1 mg at 09/30/20 1658   ondansetron (ZOFRAN) tablet 4 mg  4 mg Oral Q6H PRN Edmisten, Kristie L, PA       Or   ondansetron (ZOFRAN) injection 4 mg  4 mg Intravenous Q6H PRN Edmisten, Kristie L, PA       oxyCODONE (Oxy IR/ROXICODONE) immediate release tablet 5-10 mg  5-10 mg Oral Q4H PRN Edmisten, Kristie L, PA   10 mg at 10/04/20 1319   polyethylene glycol (MIRALAX / GLYCOLAX) packet 17 g  17 g Oral Daily PRN Edmisten, Kristie L, PA   17 g at 10/02/20 2022   polyvinyl alcohol (LIQUIFILM TEARS) 1.4 % ophthalmic solution 1 drop  1 drop Both Eyes PRN Aluisio, Pilar Plate, MD       rivaroxaban (XARELTO) tablet 10 mg  10 mg Oral Q breakfast Edmisten, Kristie L, PA   10 mg at 10/04/20 0805   sodium phosphate (FLEET) 7-19 GM/118ML enema 1 enema  1 enema Rectal Once PRN Edmisten, Kristie L, PA       traMADol (ULTRAM) tablet 50-100 mg  50-100 mg Oral Q6H PRN Edmisten, Kristie L, PA   100 mg at 10/03/20 1141     Discharge Medications: Please see discharge summary for a list of discharge medications.  Relevant Imaging Results:  Relevant Lab Results:   Additional Information SSN: [id number]  Sherie Don, LCSW

## 2020-10-04 NOTE — Progress Notes (Signed)
   Subjective: 4 Days Post-Op Procedure(s) (LRB): TOTAL KNEE ARTHROPLASTY (Left) Patient reports pain as mild.   Patient seen in rounds for Dr. Wynelle Link. Patient continues to progress slowly with physical therapy. Has not made enough improvement at this point to safely discharge home. Denies chest pain or SOB. No issues overnight.   Objective: Vital signs in last 24 hours: Temp:  [98 F (36.7 C)-98.4 F (36.9 C)] 98 F (36.7 C) (08/19 0508) Pulse Rate:  [73-93] 73 (08/19 0508) Resp:  [17-18] 17 (08/19 0508) BP: (125-153)/(55-69) 125/64 (08/19 0508) SpO2:  [94 %-97 %] 94 % (08/19 0508)  Intake/Output from previous day:  Intake/Output Summary (Last 24 hours) at 10/04/2020 0706 Last data filed at 10/04/2020 0537 Gross per 24 hour  Intake 720 ml  Output 1700 ml  Net -980 ml    Intake/Output this shift: No intake/output data recorded.  Labs: Recent Labs    10/02/20 0325 10/03/20 0345  HGB 12.1 10.0*   Recent Labs    10/02/20 0325 10/03/20 0345  WBC 9.8 9.3  RBC 3.67* 3.06*  HCT 36.6 30.1*  PLT 207 182   Recent Labs    10/02/20 0325  NA 139  K 4.1  CL 105  CO2 25  BUN 15  CREATININE 0.93  GLUCOSE 113*  CALCIUM 8.9   No results for input(s): LABPT, INR in the last 72 hours.  Exam: General - Patient is Alert and Oriented Extremity - Neurologically intact Neurovascular intact Sensation intact distally Dorsiflexion/Plantar flexion intact Dressing/Incision - clean, dry, no drainage Motor Function - intact, moving foot and toes well on exam.   Past Medical History:  Diagnosis Date   Arthritis    "knees, lower back, shoulders" (11/18/2017)   Breast cancer, left breast ([place]) 1991   Breast cancer, right breast ([place]) 2010   Dyspnea    with exertion   Frequency of urination    GERD (gastroesophageal reflux disease)    Gout    "on daily RX" (11/18/2017)   Heart murmur    Hypertension     Assessment/Plan: 4 Days Post-Op Procedure(s) (LRB): TOTAL KNEE  ARTHROPLASTY (Left) Principal Problem:   OA (osteoarthritis) of knee Active Problems:   Primary osteoarthritis of left knee  Estimated body mass index is 38.67 kg/m as calculated from the following:   Height as of this encounter: 5' 3.5" (1.613 m).   Weight as of this encounter: 100.6 kg. Up with therapy  DVT Prophylaxis - Xarelto Weight-bearing as tolerated  Discharge either home or to SNF based off recommendations from first session of physical therapy this AM.  [name], PA-C Orthopedic Surgery [phone] 10/04/2020, 7:06 AM

## 2020-10-04 NOTE — Progress Notes (Signed)
Physical Therapy Treatment Patient Details Name: Sandra Wise MRN: [id number] DOB: 1940/07/30 Today's Date: 10/04/2020    History of Present Illness 80 yo female s/p L TKA 09/30/20. Hx of L rev TSA 2019, breast ca, gout    PT Comments    Pt progressing slowly toward goals. Pt continues to require +2 assist for bed mobility and transfers, unable to amb this am d/t "weakness"    Follow Up Recommendations  Follow surgeon's recommendation for DC plan and follow-up therapies;Supervision/Assistance - 24 hour;SNF     Equipment Recommendations  Rolling walker with 5" wheels    Recommendations for Other Services       Precautions / Restrictions Precautions Precautions: Fall;Knee Knee Immobilizer - Left: Discontinue once straight leg raise with < 10 degree lag Restrictions Weight Bearing Restrictions: No Other Position/Activity Restrictions: WBAT    Mobility  Bed Mobility Overal bed mobility: Needs Assistance Bed Mobility: Rolling;Sidelying to Sit Rolling: Min assist;Mod assist Sidelying to sit: Mod assist;+2 for safety/equipment       General bed mobility comments: pt able to move LEs off bed, hand over hand to reach for bed rail and turn to pt right side, assist with trunk and cues to self assist to transition from s/l to sit    Transfers Overall transfer level: Needs assistance Equipment used: Rolling walker (2 wheeled) Transfers: Sit to/from Stand Sit to Stand: Mod assist;+2 physical assistance;+2 safety/equipment Stand pivot transfers: Mod assist;+2 safety/equipment;+2 physical assistance       General transfer comment: repeated sit>stand transfers for strengthening and instruction. multimodal cues for UE and LE positioning and weight shift, pt requiring assist to rise and steady, posterior lean with standing and UEs not pushing down through RW requiring cues to correct.  Ambulation/Gait             General Gait Details: pivotal steps only today, pt is fearful of  falling and feels "too weak" to amb   Stairs             Wheelchair Mobility    Modified Rankin (Stroke Patients Only)       Balance   Sitting-balance support: Feet supported Sitting balance-Leahy Scale: Fair       Standing balance-Leahy Scale: Poor Standing balance comment: use of external support                            Cognition Arousal/Alertness: Awake/alert Behavior During Therapy: WFL for tasks assessed/performed Overall Cognitive Status: Within Functional Limits for tasks assessed                                        Exercises Total Joint Exercises Ankle Circles/Pumps: AROM;Both;10 reps    General Comments        Pertinent Vitals/Pain Pain Assessment: 0-10 Pain Score: 2  Pain Location: left knee Pain Descriptors / Indicators: Discomfort;Aching;Sore Pain Intervention(s): Limited activity within patient's tolerance;Monitored during session;Premedicated before session;Repositioned;Ice applied    Home Living                      Prior Function            PT Goals (current goals can now be found in the care plan section) Acute Rehab PT Goals Patient Stated Goal: improve functional abilities PT Goal Formulation: With patient/family Time For Goal Achievement: 10/15/20 Potential to Achieve  Goals: Good Progress towards PT goals: Progressing toward goals    Frequency    7X/week      PT Plan Current plan remains appropriate;Discharge plan needs to be updated    Co-evaluation              AM-PAC PT "6 Clicks" Mobility   Outcome Measure  Help needed turning from your back to your side while in a flat bed without using bedrails?: A Lot Help needed moving from lying on your back to sitting on the side of a flat bed without using bedrails?: A Lot Help needed moving to and from a bed to a chair (including a wheelchair)?: Total Help needed standing up from a chair using your arms (e.g., wheelchair  or bedside chair)?: Total Help needed to walk in hospital room?: Total Help needed climbing 3-5 steps with a railing? : Total 6 Click Score: 8    End of Session Equipment Utilized During Treatment: Gait belt Activity Tolerance: Patient limited by fatigue Patient left: in chair;with call bell/phone within reach;with chair alarm set   PT Visit Diagnosis: Pain;Muscle weakness (generalized) (M62.81);Other abnormalities of gait and mobility (R26.89) Pain - Right/Left: Left Pain - part of body: Knee     Time: 1132-1201 PT Time Calculation (min) (ACUTE ONLY): 29 min  Charges:  $Therapeutic Activity: 23-37 mins                     Baxter Flattery, PT  Acute Rehab Dept ([facility]) [phone] Pager [phone]  10/04/2020    [place] 10/04/2020, 1:43 PM

## 2020-10-04 NOTE — Plan of Care (Signed)
  Problem: Coping: Goal: Level of anxiety will decrease Outcome: Progressing   Problem: Pain Managment: Goal: General experience of comfort will improve Outcome: Progressing   Problem: Safety: Goal: Ability to remain free from injury will improve Outcome: Progressing   

## 2020-10-04 NOTE — Plan of Care (Signed)
  Problem: Clinical Measurements: Goal: Will remain free from infection Outcome: Progressing   Problem: Activity: Goal: Risk for activity intolerance will decrease Outcome: Progressing   Problem: Pain Managment: Goal: General experience of comfort will improve Outcome: Progressing   

## 2020-10-04 NOTE — TOC Progression Note (Signed)
Transition of Care [facility]) - Progression Note   Patient Details  Name: Janina Hoffert MRN: [id number] Date of Birth: 17-Mar-1940  Transition of Care [facility]) CM/SW [place], LCSW Phone Number: 10/04/2020, 2:37 PM  Clinical Narrative: Due to patient's limited progress with PT post-op, SNF is now being recommended. Patient is agreeable to SNF and would prefer [facility], although she will be faxed out to [facility] and [place] SNFs. Patient is unvaccinated for COVID and verbalized understanding that this will limit her bed offers and she will go to a quarantine room.  FL2 done; PASRR is pending as patient in not a [place] resident. Initial referral faxed out to SNFs in [place] and [place]. TOC awaiting bed offers.  Expected Discharge Plan: Skilled Nursing Facility Barriers to Discharge: SNF Pending bed offer  Expected Discharge Plan and Services Expected Discharge Plan: [place] In-house Referral: Clinical Social Work Post Acute Care Choice: [place] Living arrangements for the past 2 months: Single Family Home Expected Discharge Date: 10/03/20               DME Arranged: Gilford Rile rolling DME Agency: [employer] Representative spoke with at DME Agency: Pre-arranged in orthopedist's office  Readmission Risk Interventions No flowsheet data found.

## 2020-10-04 NOTE — Care Management (Cosign Needed)
RE: Sandra Wise Date of Birth: Jun 06, 1940 Date: 10/04/2020 MUST ID: [id number]   To Whom It May Concern:   Please be advised that the above name patient will require a short-term nursing home stay--anticipated 30 days or less rehabilitation and strengthening. The plan is for return home.

## 2020-10-04 NOTE — Progress Notes (Signed)
10/04/20 1400  PT Visit Information  Last PT Received On 10/04/20  Assisted pt up to [facility] to void per her request (on purewick). Continue to recommend SNF  Assistance Needed +2  History of Present Illness 80 yo female s/p L TKA 09/30/20. Hx of L rev TSA 2019, breast ca, gout  Subjective Data  Patient Stated Goal improve functional abilities  Precautions  Precautions Fall;Knee  Knee Immobilizer - Left Discontinue once straight leg raise with < 10 degree lag  Restrictions  Weight Bearing Restrictions No  Other Position/Activity Restrictions WBAT  Pain Assessment  Pain Assessment 0-10  Pain Score 3  Pain Location left knee  Pain Descriptors / Indicators Discomfort;Aching;Sore  Pain Intervention(s) Limited activity within patient's tolerance;Monitored during session  Cognition  Arousal/Alertness Awake/alert  Behavior During Therapy [facility] for tasks assessed/performed  Overall Cognitive Status Within Functional Limits for tasks assessed  Bed Mobility  Overal bed mobility Needs Assistance  Bed Mobility Rolling;Sidelying to Sit;Sit to Supine  Rolling Min assist;Mod assist  Sidelying to sit Mod assist;+2 for safety/equipment;+2 for physical assistance  Sit to supine Mod assist  General bed mobility comments pt able to move LEs off bed, hand over hand to reach for bed rail and turn to pt right side, assist with trunk and cues to self assist to transition from s/l to sit. assist to lift LEs on to bed  Transfers  Overall transfer level Needs assistance  Equipment used Rolling walker (2 wheeled)  Transfers Sit to/from [employer] Transfers  Sit to Stand Mod assist;+2 physical assistance;+2 safety/equipment  Stand pivot transfers Mod assist;+2 safety/equipment;+2 physical assistance  General transfer comment multimodal cues for UE and LE positioning and weight shift, pt requiring assist to rise and steady, posterior lean with standing and UEs not pushing down through RW requiring cues to  correct  Balance  Sitting-balance support Feet supported  Sitting balance-Leahy Scale Fair  Standing balance-Leahy Scale Poor  Standing balance comment use of external support  PT - End of Session  Equipment Utilized During Treatment Gait belt  Activity Tolerance Patient limited by fatigue  Patient left in chair;with call bell/phone within reach;with chair alarm set   PT - Assessment/Plan  PT Plan Current plan remains appropriate;Discharge plan needs to be updated  PT Visit Diagnosis Pain;Muscle weakness (generalized) (M62.81);Other abnormalities of gait and mobility (R26.89)  Pain - Right/Left Left  Pain - part of body Knee  PT Frequency (ACUTE ONLY) 7X/week  Follow Up Recommendations Follow surgeon's recommendation for DC plan and follow-up therapies;Supervision/Assistance - 24 hour;SNF  PT equipment Rolling walker with 5" wheels  AM-PAC PT "6 Clicks" Mobility Outcome Measure (Version 2)  Help needed turning from your back to your side while in a flat bed without using bedrails? 2  Help needed moving from lying on your back to sitting on the side of a flat bed without using bedrails? 2  Help needed moving to and from a bed to a chair (including a wheelchair)? 1  Help needed standing up from a chair using your arms (e.g., wheelchair or bedside chair)? 1  Help needed to walk in hospital room? 1  Help needed climbing 3-5 steps with a railing?  1  6 Click Score 8  Consider Recommendation of Discharge To: CIR/SNF/LTACH  PT Goal Progression  Progress towards PT goals Progressing toward goals (slowly)  Acute Rehab PT Goals  PT Goal Formulation With patient/family  Time For Goal Achievement 10/15/20  Potential to Achieve Goals Good  PT Time  Calculation  PT Start Time (ACUTE ONLY) 1430  PT Stop Time (ACUTE ONLY) 1500  PT Time Calculation (min) (ACUTE ONLY) 30 min  PT General Charges  $$ ACUTE PT VISIT 1 Visit  PT Treatments  $Therapeutic Activity 23-37 mins

## 2020-10-04 NOTE — Care Management Important Message (Signed)
Important Message  Patient Details IM Letter given to the Patient. Name: Sandra Wise MRN: [id number] Date of Birth: 01-15-1941   Medicare Important Message Given:  Yes     Kerin Salen 10/04/2020, 12:32 PM

## 2020-10-04 NOTE — Discharge Summary (Signed)
Physician Discharge Summary   Patient ID: Sandra Wise MRN: [id number] DOB/AGE: Apr 12, 1940 80 y.o.  Admit date: 09/30/2020 Discharge date: 10/08/2018  Primary Diagnosis: Osteoarthritis, left knee  Admission Diagnoses:  Past Medical History:  Diagnosis Date   Arthritis    "knees, lower back, shoulders" (11/18/2017)   Breast cancer, left breast ([place]) 1991   Breast cancer, right breast ([place]) 2010   Dyspnea    with exertion   Frequency of urination    GERD (gastroesophageal reflux disease)    Gout    "on daily RX" (11/18/2017)   Heart murmur    Hypertension    Discharge Diagnoses:   Principal Problem:   OA (osteoarthritis) of knee Active Problems:   Primary osteoarthritis of left knee  Estimated body mass index is 38.67 kg/m as calculated from the following:   Height as of this encounter: 5' 3.5" (1.613 m).   Weight as of this encounter: 100.6 kg.  Procedure:  Procedure(s) (LRB): TOTAL KNEE ARTHROPLASTY (Left)   Consults: None  HPI: Sandra Wise is a 80 y.o. year old female with end stage OA of her left knee with progressively worsening pain and dysfunction. She has constant pain, with activity and at rest and significant functional deficits with difficulties even with ADLs. She has had extensive non-op management including analgesics, injections of cortisone and viscosupplements, and home exercise program, but remains in significant pain with significant dysfunction. Radiographs show bone on bone arthritis medial and patellofemoral. She presents now for left Total Knee Arthroplasty.   Laboratory Data: Admission on 09/30/2020  Component Date Value Ref Range Status   ABO/RH(D) 09/30/2020    Final                   Value:A POS Performed at [facility], [place] [address], [place], [place] [address]    WBC 10/01/2020 8.4  4.0 - 10.5 K/uL Final   RBC 10/01/2020 3.42 (A) 3.87 - 5.11 MIL/uL Final   Hemoglobin 10/01/2020 11.2 (A) 12.0 - 15.0 g/dL Final   HCT  10/01/2020 34.0 (A) 36.0 - 46.0 % Final   MCV 10/01/2020 99.4  80.0 - 100.0 fL Final   MCH 10/01/2020 32.7  26.0 - 34.0 pg Final   MCHC 10/01/2020 32.9  30.0 - 36.0 g/dL Final   RDW 10/01/2020 13.2  11.5 - 15.5 % Final   Platelets 10/01/2020 208  150 - 400 K/uL Final   nRBC 10/01/2020 0.0  0.0 - 0.2 % Final   Performed at [facility], [place] [address], [place], [place] [address]   Sodium 10/01/2020 135  135 - 145 mmol/L Final   Potassium 10/01/2020 4.0  3.5 - 5.1 mmol/L Final   Chloride 10/01/2020 105  98 - 111 mmol/L Final   CO2 10/01/2020 24  22 - 32 mmol/L Final   Glucose, Bld 10/01/2020 124 (A) 70 - 99 mg/dL Final   Glucose reference range applies only to samples taken after fasting for at least 8 hours.   BUN 10/01/2020 19  8 - 23 mg/dL Final   Creatinine, Ser 10/01/2020 1.05 (A) 0.44 - 1.00 mg/dL Final   Calcium 10/01/2020 8.2 (A) 8.9 - 10.3 mg/dL Final   GFR, Estimated 10/01/2020 53 (A) >60 mL/min Final   Comment: (NOTE) Calculated using the CKD-EPI Creatinine Equation (2021)    Anion gap 10/01/2020 6  5 - 15 Final   Performed at [facility], [place] [address], [place], [place] [address]   WBC 10/02/2020  9.8  4.0 - 10.5 K/uL Final   RBC 10/02/2020 3.67 (A) 3.87 - 5.11 MIL/uL Final   Hemoglobin 10/02/2020 12.1  12.0 - 15.0 g/dL Final   HCT 10/02/2020 36.6  36.0 - 46.0 % Final   MCV 10/02/2020 99.7  80.0 - 100.0 fL Final   MCH 10/02/2020 33.0  26.0 - 34.0 pg Final   MCHC 10/02/2020 33.1  30.0 - 36.0 g/dL Final   RDW 10/02/2020 12.9  11.5 - 15.5 % Final   Platelets 10/02/2020 207  150 - 400 K/uL Final   nRBC 10/02/2020 0.0  0.0 - 0.2 % Final   Performed at [facility], [place] [address], [place], [place] [address]   Sodium 10/02/2020 139  135 - 145 mmol/L Final   Potassium 10/02/2020 4.1  3.5 - 5.1 mmol/L Final   Chloride 10/02/2020 105  98 - 111 mmol/L Final   CO2 10/02/2020 25  22 - 32 mmol/L Final   Glucose, Bld  10/02/2020 113 (A) 70 - 99 mg/dL Final   Glucose reference range applies only to samples taken after fasting for at least 8 hours.   BUN 10/02/2020 15  8 - 23 mg/dL Final   Creatinine, Ser 10/02/2020 0.93  0.44 - 1.00 mg/dL Final   Calcium 10/02/2020 8.9  8.9 - 10.3 mg/dL Final   GFR, Estimated 10/02/2020 >60  >60 mL/min Final   Comment: (NOTE) Calculated using the CKD-EPI Creatinine Equation (2021)    Anion gap 10/02/2020 9  5 - 15 Final   Performed at [facility], [place] [address], [place], [place] [address]   WBC 10/03/2020 9.3  4.0 - 10.5 K/uL Final   RBC 10/03/2020 3.06 (A) 3.87 - 5.11 MIL/uL Final   Hemoglobin 10/03/2020 10.0 (A) 12.0 - 15.0 g/dL Final   HCT 10/03/2020 30.1 (A) 36.0 - 46.0 % Final   MCV 10/03/2020 98.4  80.0 - 100.0 fL Final   MCH 10/03/2020 32.7  26.0 - 34.0 pg Final   MCHC 10/03/2020 33.2  30.0 - 36.0 g/dL Final   RDW 10/03/2020 13.2  11.5 - 15.5 % Final   Platelets 10/03/2020 182  150 - 400 K/uL Final   nRBC 10/03/2020 0.0  0.0 - 0.2 % Final   Performed at [facility], [place] [address] [place], [place] [address]  Orders Only on 09/26/2020  Component Date Value Ref Range Status   SARS Coronavirus 2 09/26/2020 RESULT: NEGATIVE   Final   Comment: RESULT: NEGATIVESARS-CoV-2 INTERPRETATION:A NEGATIVE  test result means that SARS-CoV-2 RNA was not present in the specimen above the limit of detection of this test. This does not preclude a possible SARS-CoV-2 infection and should not be used as the  sole basis for patient management decisions. Negative results must be combined with clinical observations, patient history, and epidemiological information. Optimum specimen types and timing for peak viral levels during infections caused by SARS-CoV-2  have not been determined. Collection of multiple specimens or types of specimens may be necessary to detect virus. Improper specimen collection and handling, sequence variability under  primers/probes, or organism present below the limit of detection may  lead to false negative results. Positive and negative predictive values of testing are highly dependent on prevalence. False negative test results are more likely when prevalence of disease is high.The expected result is NEGATIVE.Fact S  heet for  Healthcare Providers: LocalChronicle.no Sheet for Patients: SalonLookup.es Reference Range - Negative   Hospital Outpatient Visit on 09/24/2020  Component Date Value Ref Range Status   MRSA, PCR 09/24/2020 NEGATIVE  NEGATIVE Final   Staphylococcus aureus 09/24/2020 NEGATIVE  NEGATIVE Final   Comment: (NOTE) The Xpert SA Assay (FDA approved for NASAL specimens in patients 41 years of age and older), is one component of a comprehensive surveillance program. It is not intended to diagnose infection nor to guide or monitor treatment. Performed at [facility], [place] [address] [place], [place] [address]    ABO/RH(D) 09/24/2020 A POS   Final   Antibody Screen 09/24/2020 NEG   Final   Sample Expiration 09/24/2020 10/03/2020,2359   Final   Extend sample reason 09/24/2020    Final                   Value:NO TRANSFUSIONS OR PREGNANCY IN THE PAST 3 MONTHS Performed at [place] [address] [place], [place] [address]      X-Rays:No results found.  EKG: Orders placed or performed during the hospital encounter of 11/12/17   EKG 12 lead   EKG 12 lead     Hospital Course: Sandra Wise is a 80 y.o. who was admitted to [facility]. They were brought to the operating room on 09/30/2020 and underwent Procedure(s): TOTAL KNEE ARTHROPLASTY.  Patient tolerated the procedure well and was later transferred to the recovery room and then to the orthopaedic floor for postoperative care. They were given PO and IV analgesics for pain control following their  surgery. They were given 24 hours of postoperative antibiotics of  Anti-infectives (From admission, onward)    Start     Dose/Rate Route Frequency Ordered Stop   09/30/20 2000  ceFAZolin (ANCEF) IVPB 2g/100 mL premix        2 g 200 mL/hr over 30 Minutes Intravenous Every 6 hours 09/30/20 1615 10/01/20 0221   09/30/20 1030  ceFAZolin (ANCEF) IVPB 2g/100 mL premix        2 g 200 mL/hr over 30 Minutes Intravenous On call to O.R. 09/30/20 1024 09/30/20 1322      and started on DVT prophylaxis in the form of Xarelto.   PT and OT were ordered for total joint protocol. Discharge planning consulted to help with postop disposition and equipment needs. Patient had a decent night on the evening of surgery. They started to get up OOB with therapy on day one. Bulky dressing was removed on day two, with clean and dry Aquacel left in place. Patient's hospital stay was prolonged due to slow progression with physical therapy. By day four, patient still was not meeting her goals and PT continued to recommend SNF. Social work was consulted for SNF placement and she was discharged in stable condition on POD #7.  Diet: Regular diet Activity: WBAT Follow-up: in 2 weeks Disposition: Skilled nursing facility Discharged Condition: stable  Discharge Instructions     Call MD / Call 911   Complete by: As directed    If you experience chest pain or shortness of breath, CALL 911 and be transported to the hospital emergency room.  If you develope a fever above 101 F, pus (white drainage) or increased drainage or redness at the wound, or calf pain, call your surgeon's office.   Change dressing   Complete by: As directed    You may remove the bulky bandage (ACE wrap and gauze) two  days after surgery. You will have an adhesive waterproof bandage underneath. Leave this in place until your first follow-up appointment.   Constipation Prevention   Complete by: As directed    Drink plenty of fluids.  Prune juice may be  helpful.  You may use a stool softener, such as Colace (over the counter) 100 mg twice a day.  Use MiraLax (over the counter) for constipation as needed.   Diet - low sodium heart healthy   Complete by: As directed    Do not put a pillow under the knee. Place it under the heel.   Complete by: As directed    Driving restrictions   Complete by: As directed    No driving for two weeks   Post-operative opioid taper instructions:   Complete by: As directed    POST-OPERATIVE OPIOID TAPER INSTRUCTIONS: It is important to wean off of your opioid medication as soon as possible. If you do not need pain medication after your surgery it is ok to stop day one. Opioids include: Codeine, Hydrocodone(Norco, Vicodin), Oxycodone(Percocet, oxycontin) and hydromorphone amongst others.  Long term and even short term use of opiods can cause: Increased pain response Dependence Constipation Depression Respiratory depression And more.  Withdrawal symptoms can include Flu like symptoms Nausea, vomiting And more Techniques to manage these symptoms Hydrate well Eat regular healthy meals Stay active Use relaxation techniques(deep breathing, meditating, yoga) Do Not substitute Alcohol to help with tapering If you have been on opioids for less than two weeks and do not have pain than it is ok to stop all together.  Plan to wean off of opioids This plan should start within one week post op of your joint replacement. Maintain the same interval or time between taking each dose and first decrease the dose.  Cut the total daily intake of opioids by one tablet each day Next start to increase the time between doses. The last dose that should be eliminated is the evening dose.      TED hose   Complete by: As directed    Use stockings (TED hose) for three weeks on both leg(s).  You may remove them at night for sleeping.   Weight bearing as tolerated   Complete by: As directed       Allergies as of 10/04/2020        Reactions   Arimidex [anastrozole] Other (See Comments)   Insomnia "did not feel good with it"   Cortisone Rash   Macrobid [nitrofurantoin Macrocrystal] Rash   Other Rash   Brown cloth bandaids   Prednisone Rash        Medication List     STOP taking these medications    diclofenac sodium 1 % Gel Commonly known as: VOLTAREN   multivitamin with minerals Tabs tablet   ondansetron 4 MG tablet Commonly known as: Zofran   TURMERIC PO   vitamin C 500 MG tablet Commonly known as: ASCORBIC ACID   Zinc 50 MG Tabs       TAKE these medications    acetaminophen 650 MG CR tablet Commonly known as: TYLENOL Take 650 mg by mouth every 8 (eight) hours as needed for pain.   allopurinol 100 MG tablet Commonly known as: ZYLOPRIM Take 100 mg by mouth daily.   doxepin 10 MG capsule Commonly known as: SINEQUAN Take 10 mg by mouth at bedtime.   latanoprost 0.005 % ophthalmic solution Commonly known as: XALATAN Place 1 drop into both eyes at bedtime.  lisinopril 20 MG tablet Commonly known as: ZESTRIL Take 20 mg by mouth daily.   methocarbamol 500 MG tablet Commonly known as: ROBAXIN Take 1 tablet by mouth every 6 hours as needed for muscle spasms.   mirabegron ER 25 MG Tb24 tablet Commonly known as: MYRBETRIQ Take 25 mg by mouth daily.   oxyCODONE 5 MG immediate release tablet Commonly known as: Oxy IR/ROXICODONE Take 1 to 2 tablets  by mouth every 6 hours as needed for severe pain.   Polyethyl Glycol-Propyl Glycol 0.4-0.3 % Soln Place 1 drop into both eyes 3 (three) times daily as needed (FOR DRY/IRRITATED EYES).   rivaroxaban 10 MG Tabs tablet Commonly known as: XARELTO Take 1 tablet (10 mg total) by mouth daily with breakfast for 16 days. Then take one 81 mg aspirin once a day for three weeks. Then discontinue aspirin.   sodium chloride 0.65 % Soln nasal spray Commonly known as: OCEAN Place 1 spray into both nostrils as needed for congestion.    traMADol 50 MG tablet Commonly known as: ULTRAM Take 1 to 2 tablets  by mouth every 6  hours as needed for moderate pain. What changed:  how much to take reasons to take this               Discharge Care Instructions  (From admission, onward)           Start     Ordered   10/01/20 0000  Weight bearing as tolerated        10/01/20 0749   10/01/20 0000  Change dressing       Comments: You may remove the bulky bandage (ACE wrap and gauze) two days after surgery. You will have an adhesive waterproof bandage underneath. Leave this in place until your first follow-up appointment.   10/01/20 0749            Follow-up Information     Gaynelle Arabian, MD. Schedule an appointment as soon as possible for a visit on 10/15/2020.   Specialty: Orthopedic Surgery Contact information: [address] [place] [address] [phone]                 Signed: [name], PA-C Orthopedic Surgery 10/04/2020, 7:07 AM

## 2020-10-05 NOTE — Plan of Care (Signed)

## 2020-10-05 NOTE — Progress Notes (Signed)
Subjective: 5 Days Post-Op Procedure(s) (LRB): TOTAL KNEE ARTHROPLASTY (Left) Patient reports pain as 3 on 0-10 scale.   Denies CP or SOB.  Voiding without difficulty. Positive flatus. Wrist pain where IV pulled out Objective: Vital signs in last 24 hours: Temp:  [98.4 F (36.9 C)-99 F (37.2 C)] 99 F (37.2 C) (08/20 0522) Pulse Rate:  [83-93] 83 (08/20 0522) Resp:  [16-18] 16 (08/20 0522) BP: (125-167)/(56-76) 125/58 (08/20 0522) SpO2:  [92 %-95 %] 92 % (08/20 0522)  Intake/Output from previous day: 08/19 0701 - 08/20 0700 In: 1240 [P.O.:1240] Out: 1850 [Urine:1850] Intake/Output this shift: No intake/output data recorded.  Recent Labs    10/03/20 0345  HGB 10.0*   Recent Labs    10/03/20 0345  WBC 9.3  RBC 3.06*  HCT 30.1*  PLT 182   No results for input(s): NA, K, CL, CO2, BUN, CREATININE, GLUCOSE, CALCIUM in the last 72 hours. No results for input(s): LABPT, INR in the last 72 hours.  Neurologically intact Sensation intact distally Intact pulses distally Dorsiflexion/Plantar flexion intact Incision: scant drainage Compartment soft Right wrist mild swelling. No infection. No DVT  Assessment/Plan:  5 Days Post-Op Procedure(s) (LRB): TOTAL KNEE ARTHROPLASTY (Left) Discharge to SNF when bed available Ice right wrist   Principal Problem:   OA (osteoarthritis) of knee Active Problems:   Primary osteoarthritis of left knee      Sandra Wise 10/05/2020, '@NOW'$ 

## 2020-10-05 NOTE — Progress Notes (Signed)
Orthopedic Tech Progress Note Patient Details:  Chimera Aloe 06-05-1940 [id number]   Off @ 2010  CPM Left Knee CPM Left Knee: Off Left Knee Flexion (Degrees): 65 Left Knee Extension (Degrees): 0  Post Interventions Patient Tolerated: Well Instructions Provided: Care of device  Cerinity Zynda Ladaijah Braggs 10/05/2020, 8:30 PM

## 2020-10-05 NOTE — Progress Notes (Signed)
Orthopedic Tech Progress Note Patient Details:  Sandra Wise [birth year] [id number]   CPM Left Knee CPM Left Knee: On Left Knee Flexion (Degrees): 65 Left Knee Extension (Degrees): 0  Post Interventions Patient Tolerated: Well Instructions Provided: Care of device  Cecile Gillispie Dashara Solarz 10/05/2020, 7:33 PM

## 2020-10-05 NOTE — Progress Notes (Signed)
Physical Therapy Treatment Patient Details Name: Sandra Wise MRN: [id number] DOB: August 18, 1940 Today's Date: 10/05/2020    History of Present Illness 80 yo female s/p L TKA 09/30/20. Hx of L rev TSA 2019, breast ca, gout    PT Comments    Pt progressing, able to amb into hallway ~ 12' with min A +2--required 2 standing rests d/t fatigue during 12' distance. Less assist overall with bed mobility and transfers. Continue to recommend SNF post acute   Follow Up Recommendations  Follow surgeon's recommendation for DC plan and follow-up therapies;SNF     Equipment Recommendations  Rolling walker with 5" wheels    Recommendations for Other Services       Precautions / Restrictions Precautions Precautions: Fall;Knee Precaution Comments: pt able to lift LLE ~ 2 inches from bed, demonstrating improved quad activation Required Braces or Orthoses: Knee Immobilizer - Left Knee Immobilizer - Left: Discontinue once straight leg raise with < 10 degree lag Restrictions Other Position/Activity Restrictions: WBAT    Mobility  Bed Mobility Overal bed mobility: Needs Assistance Bed Mobility: Rolling;Sidelying to Sit Rolling: Min assist Sidelying to sit: Min assist;+2 for safety/equipment       General bed mobility comments: pt able to move LEs off bed, hand over hand to reach for bed rail and turn to pt right side, min  assist with trunk and cues to self assist to transition from s/l to sit    Transfers Overall transfer level: Needs assistance Equipment used: Rolling walker (2 wheeled) Transfers: Sit to/from Stand Sit to Stand: Mod assist;+2 physical assistance;+2 safety/equipment;Min assist         General transfer comment: multimodal cues for UE and LE positioning and weight shift, pt requiring assist to rise and steady, posterior lean with standing and UEs not pushing down through RW requiring cues to correct  Ambulation/Gait Ambulation/Gait assistance: Min assist;+2  safety/equipment;Mod assist Gait Distance (Feet): 12 Feet Assistive device: Rolling walker (2 wheeled) Gait Pattern/deviations: Step-to pattern;Decreased weight shift to left;Antalgic Gait velocity: decr   General Gait Details: multi-modal cues for posture, sequence, wt shift and step length   Stairs             Wheelchair Mobility    Modified Rankin (Stroke Patients Only)       Balance   Sitting-balance support: Feet supported Sitting balance-Leahy Scale: Fair       Standing balance-Leahy Scale: Poor Standing balance comment: use of external support                            Cognition Arousal/Alertness: Awake/alert Behavior During Therapy: WFL for tasks assessed/performed Overall Cognitive Status: Within Functional Limits for tasks assessed                                        Exercises Total Joint Exercises Ankle Circles/Pumps: AROM;Both;10 reps Quad Sets: AROM;Left;5 reps Straight Leg Raises: AROM;Left;5 reps    General Comments        Pertinent Vitals/Pain Pain Assessment: 0-10 Faces Pain Scale: Hurts little more Pain Location: right wrist (previous IV site) and L knee Pain Descriptors / Indicators: Discomfort;Sore Pain Intervention(s): Limited activity within patient's tolerance;Monitored during session;Premedicated before session;Repositioned;Ice applied    Home Living                      Prior Function  PT Goals (current goals can now be found in the care plan section) Acute Rehab PT Goals Patient Stated Goal: improve functional abilities PT Goal Formulation: With patient/family Time For Goal Achievement: 10/15/20 Potential to Achieve Goals: Good Progress towards PT goals: Progressing toward goals    Frequency    7X/week      PT Plan Current plan remains appropriate    Co-evaluation              AM-PAC PT "6 Clicks" Mobility   Outcome Measure  Help needed turning from  your back to your side while in a flat bed without using bedrails?: A Lot Help needed moving from lying on your back to sitting on the side of a flat bed without using bedrails?: A Lot Help needed moving to and from a bed to a chair (including a wheelchair)?: Total Help needed standing up from a chair using your arms (e.g., wheelchair or bedside chair)?: Total Help needed to walk in hospital room?: Total Help needed climbing 3-5 steps with a railing? : Total 6 Click Score: 8    End of Session Equipment Utilized During Treatment: Gait belt Activity Tolerance: Patient tolerated treatment well Patient left: in chair;with call bell/phone within reach;with chair alarm set;with family/visitor present   PT Visit Diagnosis: Pain;Muscle weakness (generalized) (M62.81);Other abnormalities of gait and mobility (R26.89) Pain - Right/Left: Left Pain - part of body: Knee     Time: [id number] PT Time Calculation (min) (ACUTE ONLY): 24 min  Charges:  $Gait Training: 8-22 mins $Therapeutic Activity: 8-22 mins                     Baxter Flattery, PT  Acute Rehab Dept ([place]) [phone] Pager [phone]  10/05/2020    [facility] 10/05/2020, 11:54 AM

## 2020-10-05 NOTE — Plan of Care (Signed)
  Problem: Education: Goal: Knowledge of General Education information will improve Description Including pain rating scale, medication(s)/side effects and non-pharmacologic comfort measures Outcome: Progressing   

## 2020-10-05 NOTE — Progress Notes (Signed)
Orthopedic Tech Progress Note Patient Details:  Sandra Wise 15-Mar-1940 [id number]  CPM dropped off as order was still active/maintaining until DC. Colletta Maryland, RN communicated she would call when Pt returned to bed from chair to be placed in CPM, if needed.  Patient ID: Sandra Wise, female   DOB: 1940/04/06, 80 y.o.   MRN: [id number]  Sandra Wise 10/05/2020, 1:25 PM

## 2020-10-06 ENCOUNTER — Inpatient Hospital Stay (HOSPITAL_COMMUNITY): Payer: Medicare Other

## 2020-10-06 LAB — URIC ACID: Uric Acid, Serum: 4.5 mg/dL (ref 2.5–7.1)

## 2020-10-06 NOTE — Progress Notes (Signed)
Mechele Claude PA notified of pt right wrist pain that is persistent. Rn discussed concern with PA and orders placed in EMR. Uric acid and ultrasound ordered. RN will continue to monitor.

## 2020-10-06 NOTE — Plan of Care (Signed)
  Problem: Education: Goal: Knowledge of General Education information will improve Description: Including pain rating scale, medication(s)/side effects and non-pharmacologic comfort measures Outcome: Progressing   Problem: Nutrition: Goal: Adequate nutrition will be maintained Outcome: Progressing   Problem: Coping: Goal: Level of anxiety will decrease Outcome: Progressing   Problem: Elimination: Goal: Will not experience complications related to bowel motility Outcome: Progressing Goal: Will not experience complications related to urinary retention Outcome: Progressing   Problem: Pain Managment: Goal: General experience of comfort will improve Outcome: Progressing   Problem: Safety: Goal: Ability to remain free from injury will improve Outcome: Progressing   

## 2020-10-06 NOTE — Progress Notes (Signed)
Subjective: 6 Days Post-Op Procedure(s) (LRB): TOTAL KNEE ARTHROPLASTY (Left)  Patient reports pain as mild to moderate.  Patient reports more pain from her R wrist where the IV was in comparison to her L knee.  Notes that her wrist pain kept her up most of the night.  Denies fever, chills, N/V, CP, SOB.  Admits to flatus.  Tolerating POs well.  Objective:   VITALS:  Temp:  [98.8 F (37.1 C)-98.9 F (37.2 C)] 98.8 F (37.1 C) (08/21 0452) Pulse Rate:  [82-96] 82 (08/21 0452) Resp:  [17] 17 (08/21 0452) BP: (137-142)/(63-64) 142/64 (08/21 0452) SpO2:  [93 %] 93 % (08/21 0452)  General: WDWN patient in NAD. Psych:  Appropriate mood and affect. Neuro:  A&O x 3, Moving all extremities, sensation intact to light touch HEENT:  EOMs intact Chest:  Even non-labored respirations Skin:  Incision C/D/I, no rashes or lesions Extremities: warm/dry, mild edema to L Knee, no erythema or echymosis.  No lymphadenopathy.  Tender to palpation at previous IV site at R wrist.  2/5 grip strength. Pulses: Popliteus 2+.  Radial 2 + MSK:  ROM: Lacks 5 degrees TKE, MMT: able to perform quad set   LABS No results for input(s): HGB, WBC, PLT in the last 72 hours. No results for input(s): NA, K, CL, CO2, BUN, CREATININE, GLUCOSE in the last 72 hours. No results for input(s): LABPT, INR in the last 72 hours.   Assessment/Plan: 6 Days Post-Op Procedure(s) (LRB): TOTAL KNEE ARTHROPLASTY (Left)  Patient seen in rounds for Dr. Wynelle Link. WBAT L LE Up with therapy Cont to ice R wrist prn.  D/C to SNF when bed available Plan for outpatient post-op visit with Dr. Wynelle Link.  Mechele Claude PA-C [facility] Office:  [phone]

## 2020-10-06 NOTE — Plan of Care (Signed)
  Problem: Education: Goal: Knowledge of General Education information will improve Description Including pain rating scale, medication(s)/side effects and non-pharmacologic comfort measures Outcome: Progressing   

## 2020-10-06 NOTE — Progress Notes (Signed)
Called ultrasound to follow up/ask when an ultrasound was going to be done on the patient's right hand. Received verbal order from [place], [place] for ultrasound to be done on patient's right hand due to patient's complaints of pain after her IV had been painful after IV removal. Ultrasound said they have a couple of other patients to see. Will continue to monitor.

## 2020-10-06 NOTE — Progress Notes (Addendum)
Physical Therapy Treatment Patient Details Name: Sandra Wise MRN: [id number] DOB: 10/22/1940 Today's Date: 10/06/2020    History of Present Illness 80 yo female s/p L TKA 09/30/20. Hx of L rev TSA 2019, breast ca, gout    PT Comments    Pt reports incr R wrist pain, pain woke her during the night and she could not return to sleep. R wrist painful to touch, incr edema and erythema compared to yesterday. Unable wrist flexion/extension or supination d/t pain. RN aware, contacted PA as well.   Follow Up Recommendations  Follow surgeon's recommendation for DC plan and follow-up therapies;SNF     Equipment Recommendations  Rolling walker with 5" wheels    Recommendations for Other Services       Precautions / Restrictions Precautions Precautions: Fall;Knee Required Braces or Orthoses: Knee Immobilizer - Left Knee Immobilizer - Left: Discontinue once straight leg raise with < 10 degree lag Restrictions Weight Bearing Restrictions: No LLE Weight Bearing: Weight bearing as tolerated    Mobility  Bed Mobility               General bed mobility comments: deferred OOB d/t severe wrist pain    Transfers                    Ambulation/Gait                 Stairs             Wheelchair Mobility    Modified Rankin (Stroke Patients Only)       Balance                                            Cognition Arousal/Alertness: Awake/alert Behavior During Therapy: WFL for tasks assessed/performed Overall Cognitive Status: Within Functional Limits for tasks assessed                                        Exercises Total Joint Exercises Ankle Circles/Pumps: AROM;Both;10 reps Quad Sets: AROM;Both;10 reps Towel Squeeze: AROM;Both;10 reps Heel Slides: 10 reps;Supine;AROM;AAROM;Both Hip ABduction/ADduction: AROM;Left;10 reps;Supine Straight Leg Raises: AROM;10 reps;Both    General Comments        Pertinent  Vitals/Pain Pain Assessment: Faces Faces Pain Scale: Hurts worst Pain Location: right wrist Pain Descriptors / Indicators: Grimacing;Guarding;Sore Pain Intervention(s): Limited activity within patient's tolerance;Monitored during session;Repositioned;Premedicated before session    Home Living                      Prior Function            PT Goals (current goals can now be found in the care plan section) Acute Rehab PT Goals Patient Stated Goal: improve functional abilities PT Goal Formulation: With patient/family Time For Goal Achievement: 10/15/20 Potential to Achieve Goals: Good Progress towards PT goals: Not progressing toward goals - comment (pain)    Frequency    7X/week      PT Plan Current plan remains appropriate    Co-evaluation              AM-PAC PT "6 Clicks" Mobility   Outcome Measure  Help needed turning from your back to your side while in a flat bed without using bedrails?: A Lot Help needed moving  from lying on your back to sitting on the side of a flat bed without using bedrails?: A Lot Help needed moving to and from a bed to a chair (including a wheelchair)?: Total Help needed standing up from a chair using your arms (e.g., wheelchair or bedside chair)?: Total Help needed to walk in hospital room?: Total Help needed climbing 3-5 steps with a railing? : Total 6 Click Score: 8    End of Session Equipment Utilized During Treatment: Gait belt (to self assist with exercises) Activity Tolerance: Patient tolerated treatment well (OOB limited by pain) Patient left: in bed;with call bell/phone within reach;with bed alarm set Nurse Communication: Mobility status PT Visit Diagnosis: Pain;Muscle weakness (generalized) (M62.81);Other abnormalities of gait and mobility (R26.89) Pain - Right/Left: Left Pain - part of body: Knee     Time: [id number] PT Time Calculation (min) (ACUTE ONLY): 24 min  Charges:  $Therapeutic Exercise: 23-37  mins                     Baxter Flattery, PT  Acute Rehab Dept ([place]) [phone] Pager [phone]  10/06/2020    [facility] 10/06/2020, 11:47 AM

## 2020-10-07 LAB — RESP PANEL BY RT-PCR (FLU A&B, COVID) ARPGX2
Influenza A by PCR: NEGATIVE
Influenza B by PCR: NEGATIVE
SARS Coronavirus 2 by RT PCR: NEGATIVE

## 2020-10-07 NOTE — Care Management Important Message (Signed)
Important Message  Patient Details IM Letter given to the Patient. Name: Sandra Wise MRN: [id number] Date of Birth: 1941/02/17   Medicare Important Message Given:  Yes     Kerin Salen 10/07/2020, 1:51 PM

## 2020-10-07 NOTE — Progress Notes (Signed)
Physical Therapy Treatment Patient Details Name: Sandra Wise MRN: [id number] DOB: April 05, 1940 Today's Date: 10/07/2020    History of Present Illness 80 yo female s/p L TKA 09/30/20. Hx of L rev TSA 2019, breast ca, gout    PT Comments    Continues to progress slowly. Incr gait distance today. Pt with ince L UE pain, seems to be having some upper trap spasms, decr  cervical ROM d/t pain. Continue to recommend SNF  Follow Up Recommendations  Follow surgeon's recommendation for DC plan and follow-up therapies;SNF     Equipment Recommendations  Rolling walker with 5" wheels    Recommendations for Other Services       Precautions / Restrictions Precautions Precautions: Fall;Knee Precaution Comments: pt able to lift LLE ~ 2 inches from bed, demonstrating improved quad activation Required Braces or Orthoses: Knee Immobilizer - Left Knee Immobilizer - Left: Discontinue once straight leg raise with < 10 degree lag Restrictions Weight Bearing Restrictions: No LLE Weight Bearing: Weight bearing as tolerated Other Position/Activity Restrictions: WBAT    Mobility  Bed Mobility Overal bed mobility: Needs Assistance Bed Mobility: Rolling;Sidelying to Sit Rolling: Min assist Sidelying to sit: Min assist;+2 for physical assistance       General bed mobility comments: pt able to move LEs off bed, hand over hand to reach for bed rail and turn to pt right side, min  assist with trunk and cues to self assist to transition from s/l to sit, incr time and effort. bed pad used to assist scooting to EOB    Transfers Overall transfer level: Needs assistance Equipment used: Ambulation equipment used (EVA walker) Transfers: Sit to/from Stand Sit to Stand: Mod assist;+2 physical assistance;+2 safety/equipment;Min assist;From elevated surface         General transfer comment: multimodal cues for UE and LE positioning and weight shift, pt requiring assist to rise and steady, posterior lean with  standing and UEs not pushing down through RW requiring cues to correct  Ambulation/Gait Ambulation/Gait assistance: Min assist;+2 safety/equipment;Mod assist Gait Distance (Feet): 25 Feet Assistive device: Rolling walker (2 wheeled) (EVA walker) Gait Pattern/deviations: Step-to pattern;Decreased weight shift to left Gait velocity: decr   General Gait Details: multi-modal cues for posture, sequence, wt shift and step length. EVA walker used d/t severe L UE/wrist neck pain   Stairs             Wheelchair Mobility    Modified Rankin (Stroke Patients Only)       Balance   Sitting-balance support: Feet supported Sitting balance-Leahy Scale: Fair       Standing balance-Leahy Scale: Poor Standing balance comment: use of external support                            Cognition Arousal/Alertness: Awake/alert Behavior During Therapy: WFL for tasks assessed/performed Overall Cognitive Status: Within Functional Limits for tasks assessed                                        Exercises Total Joint Exercises Ankle Circles/Pumps: AROM;Both;10 reps    General Comments        Pertinent Vitals/Pain Pain Assessment: 0-10 Faces Pain Scale: Hurts little more Pain Location: right UE/neck and L knee Pain Descriptors / Indicators: Discomfort;Sore Pain Intervention(s): Limited activity within patient's tolerance;Monitored during session;Premedicated before session;Repositioned;Heat applied;Ice applied (ice to  knee heat  to wrist and neck)    Home Living                      Prior Function            PT Goals (current goals can now be found in the care plan section) Acute Rehab PT Goals Patient Stated Goal: improve functional abilities PT Goal Formulation: With patient/family Time For Goal Achievement: 10/15/20 Potential to Achieve Goals: Good Progress towards PT goals: Progressing toward goals    Frequency    7X/week      PT  Plan Current plan remains appropriate    Co-evaluation              AM-PAC PT "6 Clicks" Mobility   Outcome Measure  Help needed turning from your back to your side while in a flat bed without using bedrails?: A Lot Help needed moving from lying on your back to sitting on the side of a flat bed without using bedrails?: A Lot Help needed moving to and from a bed to a chair (including a wheelchair)?: Total Help needed standing up from a chair using your arms (e.g., wheelchair or bedside chair)?: Total Help needed to walk in hospital room?: Total Help needed climbing 3-5 steps with a railing? : Total 6 Click Score: 8    End of Session Equipment Utilized During Treatment: Gait belt Activity Tolerance: Patient tolerated treatment well Patient left: in chair;with call bell/phone within reach;with chair alarm set;with family/visitor present   PT Visit Diagnosis: Pain;Muscle weakness (generalized) (M62.81);Other abnormalities of gait and mobility (R26.89) Pain - Right/Left: Left Pain - part of body: Knee     Time: 1128-1206 PT Time Calculation (min) (ACUTE ONLY): 38 min  Charges:  $Gait Training: 23-37 mins $Therapeutic Activity: 8-22 mins                     Baxter Flattery, PT  Acute Rehab Dept ([place]) [phone] Pager [phone]  10/07/2020    [facility] 10/07/2020, 12:21 PM

## 2020-10-07 NOTE — TOC Transition Note (Signed)
Transition of Care [facility]) - CM/SW Discharge Note   Patient Details  Name: Sandra Wise MRN: [id number] Date of Birth: 1940/11/06  [facility] CM/SW Contact:  Lennart Pall, LCSW Phone Number: 10/07/2020, 2:09 PM   Clinical Narrative:     Have received SNF bed offer from [facility] and pt has accepted.  COVID test completed/ neg. Facility can admit pt today and MD has cleared for dc.  PTAR called at 2:00pm.  RN to call report to [phone]. No further TOC needs.  Final next level of care: Skilled Nursing Facility Barriers to Discharge: Barriers Resolved   Patient Goals and CMS Choice Patient states their goals for this hospitalization and ongoing recovery are:: Go to short-term rehab CMS Medicare.gov Compare Post Acute Care list provided to:: Patient Choice offered to / list presented to : Patient  Discharge Placement PASRR number recieved: 10/04/20            Patient chooses bed at: [facility] Patient to be transferred to facility by: [place] Name of family member notified: pt and spouse Patient and family notified of of transfer: 10/07/20  Discharge Plan and Services In-house Referral: Clinical Social Work   Post Acute Care Choice: [place]          DME Arranged: [facility] rolling DME Agency: Archivist spoke with at DME Agency: Pre-arranged in orthopedist's office            Social Determinants of Health ([place]) Interventions     Readmission Risk Interventions No flowsheet data found.

## 2020-10-07 NOTE — Progress Notes (Signed)
   Subjective: 7 Days Post-Op Procedure(s) (LRB): TOTAL KNEE ARTHROPLASTY (Left) Patient reports pain in the left knee as minimal. Continues to complain of wrist pain.  Patient seen in rounds by Dr. Wynelle Link.  Objective: Vital signs in last 24 hours: Temp:  [98.3 F (36.8 C)-98.7 F (37.1 C)] 98.3 F (36.8 C) (08/22 0612) Pulse Rate:  [73-88] 73 (08/22 0612) Resp:  [16-17] 16 (08/22 0612) BP: (126-146)/(53-67) 126/59 (08/22 0612) SpO2:  [92 %-93 %] 92 % (08/22 0612)  Intake/Output from previous day:  Intake/Output Summary (Last 24 hours) at 10/07/2020 0710 Last data filed at 10/07/2020 0612 Gross per 24 hour  Intake 720 ml  Output 2700 ml  Net -1980 ml    Intake/Output this shift: No intake/output data recorded.  Labs: No results for input(s): HGB in the last 72 hours. No results for input(s): WBC, RBC, HCT, PLT in the last 72 hours. No results for input(s): NA, K, CL, CO2, BUN, CREATININE, GLUCOSE, CALCIUM in the last 72 hours. No results for input(s): LABPT, INR in the last 72 hours.  Exam: General - Patient is Alert and Oriented Extremity - Neurologically intact Neurovascular intact Sensation intact distally Dorsiflexion/Plantar flexion intact Dressing/Incision - clean, dry, no drainage Motor Function - intact, moving foot and toes well on exam.   Past Medical History:  Diagnosis Date   Arthritis    "knees, lower back, shoulders" (11/18/2017)   Breast cancer, left breast ([place]) 1991   Breast cancer, right breast ([place]) 2010   Dyspnea    with exertion   Frequency of urination    GERD (gastroesophageal reflux disease)    Gout    "on daily RX" (11/18/2017)   Heart murmur    Hypertension     Assessment/Plan: 7 Days Post-Op Procedure(s) (LRB): TOTAL KNEE ARTHROPLASTY (Left) Principal Problem:   OA (osteoarthritis) of knee Active Problems:   Primary osteoarthritis of left knee  Estimated body mass index is 38.67 kg/m as calculated from the following:    Height as of this encounter: 5' 3.5" (1.613 m).   Weight as of this encounter: 100.6 kg. Up with therapy  DVT Prophylaxis - Xarelto Weight-bearing as tolerated  Awaiting bed placement. Continue ice and ROM for wrist.   [name], PA-C Orthopedic Surgery [phone] 10/07/2020, 7:10 AM

## 2020-10-07 NOTE — Progress Notes (Signed)
Orthopedic Tech Progress Note Patient Details:  Sandra Wise [birth year] [id number]  CPM Left Knee CPM Left Knee: Off Left Knee Flexion (Degrees): 65 Left Knee Extension (Degrees): 0 Additional Comments: pickup CPM ,    EMS ready to transport patient.  Post Interventions Patient Tolerated: Well Instructions Provided: Care of device  [name] 10/07/2020, 4:24 PM

## 2020-10-07 NOTE — Progress Notes (Signed)
Report called to [employer] [phone].

## 2020-10-07 NOTE — Progress Notes (Signed)
Physical Therapy Treatment Patient Details Name: Sandra Wise MRN: [id number] DOB: 1940-12-22 Today's Date: 10/07/2020    History of Present Illness 80 yo female s/p L TKA 09/30/20. Hx of L rev TSA 2019, breast ca, gout    PT Comments    Continue to recommend SNF. Pt with R UE pain from lateral cervical region to wrist limiting WBing therefore utilized EVA walker (padded PFRW). After heat to upper trap area this amb, pt reports decr pain R UE this pm  Improving ability to wt shift, incr step length with gait however continues to require +2 for safety and progression. Pt husband has been doing exercises with pt.   Follow Up Recommendations  Follow surgeon's recommendation for DC plan and follow-up therapies;SNF     Equipment Recommendations  Rolling walker with 5" wheels    Recommendations for Other Services       Precautions / Restrictions Precautions Precautions: Fall;Knee Precaution Comments: pt able to lift LLE ~ 2 inches from bed, demonstrating improved quad activation Required Braces or Orthoses: Knee Immobilizer - Left Knee Immobilizer - Left: Discontinue once straight leg raise with < 10 degree lag Restrictions Other Position/Activity Restrictions: WBAT    Mobility  Bed Mobility Overal bed mobility: Needs Assistance Bed Mobility: Rolling;Sit to Supine     Supine to sit: Min assist;+2 for physical assistance;+2 for safety/equipment     General bed mobility comments: assist for trunk descent and with bil LEs on to bed, incr time    Transfers Overall transfer level: Needs assistance Equipment used: Ambulation equipment used (EVA walker) Transfers: Sit to/from Stand Sit to Stand: Mod assist;+2 physical assistance;+2 safety/equipment;Min assist         General transfer comment: multimodal cues for UE and LE positioning and weight shift, pt requiring assist to rise and steady, posterior lean with standing and UEs not pushing down through RW requiring cues to  correct  Ambulation/Gait Ambulation/Gait assistance: Min assist;+2 safety/equipment;Mod assist Gait Distance (Feet): 16 Feet Assistive device: Rolling walker (2 wheeled) (EVA walker) Gait Pattern/deviations: Step-to pattern;Decreased weight shift to left Gait velocity: decr   General Gait Details: multi-modal cues for posture, sequence, wt shift and step length, cues for beginning step through gait. EVA walker used d/t severe L UE/wrist neck pain   Stairs             Wheelchair Mobility    Modified Rankin (Stroke Patients Only)       Balance   Sitting-balance support: Feet supported Sitting balance-Leahy Scale: Fair       Standing balance-Leahy Scale: Poor Standing balance comment: use of external support                            Cognition Arousal/Alertness: Awake/alert Behavior During Therapy: WFL for tasks assessed/performed Overall Cognitive Status: Within Functional Limits for tasks assessed                                        Exercises Total Joint Exercises Ankle Circles/Pumps: AROM;Both;10 reps    General Comments        Pertinent Vitals/Pain Pain Assessment: 0-10 Faces Pain Scale: Hurts little more Pain Location: right UE/neck and L knee Pain Descriptors / Indicators: Discomfort;Sore Pain Intervention(s): Limited activity within patient's tolerance;Monitored during session;Repositioned;Ice applied    Home Living  Prior Function            PT Goals (current goals can now be found in the care plan section) Acute Rehab PT Goals Patient Stated Goal: improve functional abilities PT Goal Formulation: With patient/family Time For Goal Achievement: 10/15/20 Potential to Achieve Goals: Good Progress towards PT goals: Progressing toward goals    Frequency    7X/week      PT Plan Current plan remains appropriate    Co-evaluation              AM-PAC PT "6 Clicks"  Mobility   Outcome Measure  Help needed turning from your back to your side while in a flat bed without using bedrails?: A Lot Help needed moving from lying on your back to sitting on the side of a flat bed without using bedrails?: A Lot Help needed moving to and from a bed to a chair (including a wheelchair)?: Total Help needed standing up from a chair using your arms (e.g., wheelchair or bedside chair)?: Total Help needed to walk in hospital room?: Total Help needed climbing 3-5 steps with a railing? : Total 6 Click Score: 8    End of Session Equipment Utilized During Treatment: Gait belt Activity Tolerance: Patient tolerated treatment well Patient left: with call bell/phone within reach;with family/visitor present;in bed;with bed alarm set   PT Visit Diagnosis: Pain;Muscle weakness (generalized) (M62.81);Other abnormalities of gait and mobility (R26.89) Pain - Right/Left: Left Pain - part of body: Knee     Time: [id number] PT Time Calculation (min) (ACUTE ONLY): 23 min  Charges:  $Gait Training: 8-22 mins $Therapeutic Activity: 8-22 mins                     Baxter Flattery, PT  Acute Rehab Dept ([place]) [phone] Pager [phone]  10/07/2020    [facility] 10/07/2020, 4:06 PM

## 2021-07-17 ENCOUNTER — Ambulatory Visit: Admit: 2021-07-17 | Discharge: 2021-07-17 | Payer: MEDICARE

## 2021-07-17 ENCOUNTER — Encounter: Admit: 2021-07-17 | Discharge: 2021-07-17 | Payer: MEDICARE

## 2021-07-17 DIAGNOSIS — Z9011 Acquired absence of right breast and nipple: Principal | ICD-10-CM

## 2021-07-17 DIAGNOSIS — C50211 Malignant neoplasm of upper-inner quadrant of right female breast: Principal | ICD-10-CM

## 2021-07-17 DIAGNOSIS — Z09 Encounter for follow-up examination after completed treatment for conditions other than malignant neoplasm: Principal | ICD-10-CM

## 2022-03-04 DIAGNOSIS — R0602 Shortness of breath: Principal | ICD-10-CM

## 2022-03-04 DIAGNOSIS — J90 Pleural effusion, not elsewhere classified: Principal | ICD-10-CM

## 2022-03-05 DIAGNOSIS — R0602 Shortness of breath: Principal | ICD-10-CM

## 2022-03-05 DIAGNOSIS — J90 Pleural effusion, not elsewhere classified: Principal | ICD-10-CM

## 2022-03-09 ENCOUNTER — Ambulatory Visit: Admit: 2022-03-09 | Discharge: 2022-03-10 | Payer: MEDICARE

## 2022-03-09 DIAGNOSIS — J90 Pleural effusion, not elsewhere classified: Principal | ICD-10-CM

## 2022-03-10 ENCOUNTER — Encounter: Admit: 2022-03-10 | Discharge: 2022-03-10 | Payer: MEDICARE

## 2022-03-10 ENCOUNTER — Ambulatory Visit: Admit: 2022-03-10 | Discharge: 2022-03-10 | Payer: MEDICARE

## 2022-03-10 DIAGNOSIS — C50211 Malignant neoplasm of upper-inner quadrant of right female breast: Principal | ICD-10-CM

## 2022-03-10 DIAGNOSIS — Z09 Encounter for follow-up examination after completed treatment for conditions other than malignant neoplasm: Principal | ICD-10-CM

## 2022-03-10 DIAGNOSIS — Z9011 Acquired absence of right breast and nipple: Principal | ICD-10-CM

## 2022-03-10 MED ORDER — FUROSEMIDE 40 MG TABLET
ORAL_TABLET | Freq: Every day | ORAL | 11 refills | 30 days | Status: CP
Start: 2022-03-10 — End: 2023-03-10

## 2022-03-11 DIAGNOSIS — D0220 Carcinoma in situ of unspecified bronchus and lung: Principal | ICD-10-CM

## 2022-03-11 DIAGNOSIS — C50211 Malignant neoplasm of upper-inner quadrant of right female breast: Principal | ICD-10-CM

## 2022-03-20 DIAGNOSIS — C50911 Malignant neoplasm of unspecified site of right female breast: Principal | ICD-10-CM

## 2022-03-20 DIAGNOSIS — D0220 Carcinoma in situ of unspecified bronchus and lung: Principal | ICD-10-CM

## 2022-03-20 DIAGNOSIS — C50211 Malignant neoplasm of upper-inner quadrant of right female breast: Principal | ICD-10-CM

## 2022-03-20 DIAGNOSIS — Z170 Estrogen receptor positive status [ER+]: Principal | ICD-10-CM

## 2022-03-20 MED ORDER — PALBOCICLIB 75 MG TABLET
ORAL_TABLET | Freq: Every day | ORAL | 11 refills | 21 days | Status: CP
Start: 2022-03-20 — End: ?

## 2022-03-20 MED ORDER — PROCHLORPERAZINE MALEATE 10 MG TABLET
ORAL_TABLET | Freq: Four times a day (QID) | ORAL | 3 refills | 15 days | Status: CP | PRN
Start: 2022-03-20 — End: ?

## 2022-03-25 DIAGNOSIS — D0220 Carcinoma in situ of unspecified bronchus and lung: Principal | ICD-10-CM

## 2022-03-25 DIAGNOSIS — C50911 Malignant neoplasm of unspecified site of right female breast: Principal | ICD-10-CM

## 2022-03-25 DIAGNOSIS — C50211 Malignant neoplasm of upper-inner quadrant of right female breast: Principal | ICD-10-CM

## 2022-04-08 ENCOUNTER — Ambulatory Visit: Admit: 2022-04-08 | Discharge: 2022-04-08 | Payer: MEDICARE

## 2022-04-08 ENCOUNTER — Encounter: Admit: 2022-04-08 | Discharge: 2022-04-08 | Payer: MEDICARE

## 2022-04-08 DIAGNOSIS — C50211 Malignant neoplasm of upper-inner quadrant of right female breast: Principal | ICD-10-CM

## 2022-04-08 DIAGNOSIS — D0220 Carcinoma in situ of unspecified bronchus and lung: Principal | ICD-10-CM

## 2022-04-23 DIAGNOSIS — C50911 Malignant neoplasm of unspecified site of right female breast: Principal | ICD-10-CM

## 2022-04-28 DIAGNOSIS — Z9189 Other specified personal risk factors, not elsewhere classified: Principal | ICD-10-CM

## 2022-04-28 DIAGNOSIS — C50211 Malignant neoplasm of upper-inner quadrant of right female breast: Principal | ICD-10-CM

## 2022-04-28 MED ORDER — PROCHLORPERAZINE MALEATE 10 MG TABLET
ORAL_TABLET | Freq: Four times a day (QID) | ORAL | 3 refills | 23 days | Status: CP | PRN
Start: 2022-04-28 — End: ?

## 2022-04-28 MED ORDER — PALBOCICLIB 75 MG TABLET
ORAL_TABLET | 11 refills | 0 days | Status: CP
Start: 2022-04-28 — End: ?
  Filled 2022-04-29: qty 21, 21d supply, fill #0

## 2022-04-28 NOTE — Unmapped (Signed)
[facility] Specialty Medication Onboarding    Specialty Medication: Ibrance 75 mg tablets  Prior Authorization:   Financial Assistance:   Final Copay/Day Supply: $0 / 21    Insurance Restrictions: Yes - max 1 month supply     Notes to Pharmacist: 1 time voucher while [employer] referral pending    The triage team has completed the benefits investigation and has determined that the patient is able to fill this medication at [facility]. Please contact the patient to complete the onboarding or follow up with the prescribing physician as needed.

## 2022-04-28 NOTE — Unmapped (Signed)
[facility]   Patient Onboarding/Medication Counseling    Elaine Moran is a 82 y.o. female with Malignant neoplasm of upper-inner quadrant of right female breast who I am counseling today on initiation of therapy.  I am speaking to the patient.    Was a Nurse, learning disability used for this call? No    Verified patient's date of birth / HIPAA.    Specialty medication(s) to be sent: Hematology/Oncology: Ilda Foil 75mg       Non-specialty medications/supplies to be sent: n/a      Medications not needed at this time: n/a         Ibrance (palbociclib)    Medication & Administration     Dosage: 75mg  (1 tablet) by mouth once daily (about the same time every day) for 21 days on then off for 7 days    Administration: This is an oral medication which may be taken without regard to food.  Swallow the tablet whole and do not chew, break or crush the tablet.     Adherence/Missed dose instructions:  If you miss a dose, take it as soon as you remember that day.  If you miss a day, do not double your dose the next day.  If you vomit after taking your dose, do not take another dose and take next dose at its normally scheduled time.    Tests Required Prior to Initiation:  HR positive, HER@ negative status confirmed? Yes      Usually given with an aromatase inhibitor (often letrozole) or Faslodex    Comes in a blister card (7 day supply on each card)    Goals of Therapy     Prevent disease progression    Side Effects & Monitoring Parameters   Nausea/vomiting  Diarrhea  Mouth sores  Fatigue  Infection precautions (fever > 100.5, chills, sore throat)  Anemia  Bleeding precautions (excessive bruising, nose bleeds, gums bleeding)  Hair loss/thinning  Pulmonary toxicity (cough, shortness of breath, difficulty breathing)       The following side effects should be reported to the provider:    Heartbeat that doesn't feel normal (heart feels like it's racing, skipping a beat or fluttering)  Signs of a liver problem (dark urine, yellowing of skin and/or eyes ,fatigue, lack of appetite, nausea, abdominal pain, light colored stools, vomiting)  Signs of a kidney problem (unable to pass urine, blood in urine, change in amount of urine passed, change in urine color or weight gain)  Signs of allergic reaction (rash, hives, shortness of breath)  Signs of infection (fever > 100.4, chills, sore throat)  Unusual bleeding or bruising, nose bleeds, gums bleeding, black or tarry stools  Leg or arm swelling, redness pain and/or warm to touch  Signs of interstitial lung disease or pneumonitis (new cough, trouble breathing, shortness of breath or chest pain     Monitoring Parameters:  CBC with differential weekly prior to treatment, every 2 weeks for the first 2 cycles, then prior to each cycle and as clinically indicated   Monitor for signs/symptoms of interstitial lung disease/pneumonitis and infection   Pregnancy test prior to starting  Monitor adherence      Contraindications, Warnings, & Precautions   Mouth sores- discussed use of baking soda/salt rinses  Importance of hydration and good nutrition (high protein, BRAT, yougurt)  if having frequent diarrhea  IExposure of an unborn child to this medication could cause birth defects so you should not become pregnant while on this medication.  Effective birth control  should be used during treatment.      Drug/Food Interactions   Avoid grapefruit and grapefruit juice  Medication list reviewed in Epic. The patient was instructed to inform the care team before taking any new medications or supplements including over the counter medications, vitamins, and herbal supplements. No drug interactions identified.     Storage, Handling Precautions, & Disposal   This medication should be stored at room temperature and in a dry location. Keep out of reach of others including children and pets. Keep the medicine in the original container with a child-proof top. Do not throw away or flush unused medication down the toilet or sink. This drug is considered hazardous and should be handled as little as possible.  If someone else helps with medication administration, they should wear gloves        Current Medications (including OTC/herbals), Comorbidities and Allergies     Current Outpatient Medications   Medication Sig Dispense Refill    acetaminophen (TYLENOL) 650 MG CR tablet Take by mouth.      allopurinol (ZYLOPRIM) 100 MG tablet Take 100 mg by mouth daily.      aspirin (ECOTRIN) 81 MG tablet Take 1 tablet (81 mg total) by mouth daily.      diclofenac sodium (VOLTAREN) 1 % gel Apply topically.      doxepin (SINEQUAN) 10 MG capsule Take 10 mg by mouth nightly.      furosemide (LASIX) 40 MG tablet Take 1 tablet (40 mg total) by mouth daily. 30 tablet 11    latanoprost (XALATAN) 0.005 % ophthalmic solution Administer 1 drop to both eyes nightly.      lisinopril (PRINIVIL,ZESTRIL) 20 MG tablet Take 20 mg by mouth daily.      palbociclib (IBRANCE) 75 mg tablet Take 1 tablet (75 mg total) by mouth daily for 21 days, then stop for 7 days to complete a 28 days cycle.  Take with food. 21 tablet 11    prochlorperazine (COMPAZINE) 10 MG tablet Take 1 tablet (10 mg total) by mouth every six (6) hours as needed for nausea for up to 90 doses. 90 tablet 3     No current facility-administered medications for this visit.       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    Appropriateness of Therapy     Acute infections noted within Epic:  No active infections  Patient reported infection: None    Is medication and dose appropriate based on diagnosis and infection status? Yes    Prescription has been clinically reviewed: Yes      Baseline Quality of Life Assessment      How many days over the past month did your condition  keep you from your normal activities? For example, brushing your teeth or getting up in the morning. 0    Financial Information     Medication Assistance provided: Copay Assistance    Anticipated copay of $0 reviewed with patient. Verified delivery address.  One time mfg free month voucher  Delivery Information     Scheduled delivery date: 04/30/22    Expected start date: TBD    Patient was notified of new phone menu: Yes    Medication will be delivered via UPS to the prescription address in Epic [place].  This shipment will not require a signature.      Explained the services we provide at [facility] and that each month we would call to set up refills.  Stressed importance of returning phone calls so that we could ensure they receive their medications in time each month.  Informed patient that we should be setting up refills 7-10 days prior to when they will run out of medication.  A pharmacist will reach out to perform a clinical assessment periodically.  Informed patient that a welcome packet, containing information about our pharmacy and other support services, a Notice of Privacy Practices, and a drug information handout will be sent.      The patient or caregiver noted above participated in the development of this care plan and knows that they can request review of or adjustments to the care plan at any time.      Patient or caregiver verbalized understanding of the above information as well as how to contact the pharmacy at [phone] option 4 with any questions/concerns.  The pharmacy is open Monday through Friday 8:30am-4:30pm.  A pharmacist is available 24/7 via pager to answer any clinical questions they may have.    Patient Specific Needs     Does the patient have any physical, cognitive, or cultural barriers? No    Does the patient have adequate living arrangements? (i.e. the ability to store and take their medication appropriately) Yes    Did you identify any home environmental safety or security hazards? No    Patient prefers to have medications discussed with  Patient     Is the patient or caregiver able to read and understand education materials at a high school level or above? Yes    Patient's primary language is  English     Is the patient high risk? Yes, patient is taking oral chemotherapy. Appropriateness of therapy as been assessed    SOCIAL DETERMINANTS OF HEALTH     At the [facility], we have learned that life circumstances - like trouble affording food, housing, utilities, or transportation can affect the health of many of our patients.   That is why we wanted to ask: are you currently experiencing any life circumstances that are negatively impacting your health and/or quality of life? No    Social Determinants of Health     Financial Resource Strain: Not on file   Internet Connectivity: Not on file   Food Insecurity: Not on file   Tobacco Use: Low Risk  (04/15/2022)    Patient History     Smoking Tobacco Use: Never     Smokeless Tobacco Use: Never     Passive Exposure: Not on file   Housing/Utilities: Not on file   Alcohol Use: Not on file   Transportation Needs: Not on file   Substance Use: Not on file   Health Literacy: Not on file   Physical Activity: Not on file   Interpersonal Safety: Not on file   Stress: Not on file   Intimate Partner Violence: Not on file   Depression: Not on file   Social Connections: Not on file       Would you be willing to receive help with any of  the needs that you have identified today? Not applicable       Elaine Moran, PharmD  [facility] Specialty Pharmacist

## 2022-04-29 ENCOUNTER — Ambulatory Visit: Admit: 2022-04-29 | Discharge: 2022-04-29 | Payer: MEDICARE

## 2022-04-29 ENCOUNTER — Ambulatory Visit: Admit: 2022-04-29 | Discharge: 2022-04-30 | Payer: MEDICARE

## 2022-04-29 DIAGNOSIS — C50911 Malignant neoplasm of unspecified site of right female breast: Principal | ICD-10-CM

## 2022-04-29 DIAGNOSIS — D0220 Carcinoma in situ of unspecified bronchus and lung: Principal | ICD-10-CM

## 2022-04-29 DIAGNOSIS — R0602 Shortness of breath: Principal | ICD-10-CM

## 2022-04-29 DIAGNOSIS — R188 Other ascites: Principal | ICD-10-CM

## 2022-04-29 DIAGNOSIS — C50211 Malignant neoplasm of upper-inner quadrant of right female breast: Principal | ICD-10-CM

## 2022-04-29 LAB — COMPREHENSIVE METABOLIC PANEL
ALBUMIN: 3.4 g/dL (ref 3.3–5.5)
ALKALINE PHOSPHATASE: 70 U/L (ref 42–141)
ALT (SGPT): 41 U/L (ref 10–47)
AST (SGOT): 32 U/L (ref 11–38)
BILIRUBIN TOTAL: 0.6 mg/dL (ref 0.2–1.6)
BLOOD UREA NITROGEN: 27 mg/dL — ABNORMAL HIGH (ref 7–22)
CALCIUM: 9.8 mg/dL (ref 8.0–10.3)
CHLORIDE: 106 mmol/L (ref 98–108)
CO2: 30 mmol/L (ref 18.0–33.0)
CREATININE: 1.3 mg/dL — ABNORMAL HIGH (ref 0.60–1.20)
GLUCOSE RANDOM: 164 mg/dL — ABNORMAL HIGH (ref 73–118)
POTASSIUM: 4.3 mmol/L (ref 3.6–5.1)
PROTEIN TOTAL: 7.1 g/dL (ref 6.4–8.1)
SODIUM: 145 mmol/L (ref 128–145)

## 2022-04-29 LAB — CBC W/ AUTO DIFF
BASOPHILS ABSOLUTE COUNT: 0.1 10*9/L (ref 0.0–0.1)
BASOPHILS RELATIVE PERCENT: 1.1 %
EOSINOPHILS ABSOLUTE COUNT: 0.2 10*9/L (ref 0.0–0.7)
EOSINOPHILS RELATIVE PERCENT: 2.7 %
HEMATOCRIT: 37.8 % (ref 36.0–48.0)
HEMOGLOBIN: 12.4 g/dL (ref 12.0–16.0)
IMMATURE GRANULOCYTES ABSOLUTE COUNT: 0 10*9/L (ref 0.0–1.0)
IMMATURE GRANULOCYTES RELATIVE PERCENT: 0.2 %
LYMPHOCYTES ABSOLUTE COUNT: 2.3 10*9/L (ref 1.0–3.7)
LYMPHOCYTES RELATIVE PERCENT: 35.2 %
MEAN CORPUSCULAR HEMOGLOBIN CONC: 32.8 g/dL (ref 31.6–35.2)
MEAN CORPUSCULAR HEMOGLOBIN: 32.9 pg (ref 27.2–33.6)
MEAN CORPUSCULAR VOLUME: 100.3 fL — ABNORMAL HIGH (ref 81.0–97.0)
MEAN PLATELET VOLUME: 8.1 fL — ABNORMAL LOW (ref 8.8–12.7)
MONOCYTES ABSOLUTE COUNT: 0.5 10*9/L (ref 0.0–0.8)
MONOCYTES RELATIVE PERCENT: 6.8 %
NEUTROPHILS ABSOLUTE COUNT: 3.6 10*9/L (ref 1.4–5.7)
NEUTROPHILS RELATIVE PERCENT: 54 %
PLATELET COUNT: 233 10*9/L (ref 146–400)
RED BLOOD CELL COUNT: 3.77 10*12/L (ref 3.60–4.80)
RED CELL DISTRIBUTION WIDTH: 13.6 % (ref 11.3–15.2)
WBC ADJUSTED: 6.6 10*9/L (ref 4.0–11.0)

## 2022-04-29 MED ADMIN — fulvestrant (FASLODEX) injection 500 mg: 500 mg | INTRAMUSCULAR | @ 18:00:00 | Stop: 2022-04-29

## 2022-04-29 NOTE — Unmapped (Signed)
RETURN VISIT NOTE    Patient Name: Elaine Moran  Patient Age: 82 y.o.  Physician:  Lalla Brothers, MD   PCP:  Rolm Baptise, MD  Date of Encounter:  04/29/2022    ASSESSMENT:    1. Malignant neoplasm of upper-inner quadrant of right female breast, unspecified estrogen receptor status (CMS-HCC)     2. Status post mastectomy, right     3. Oncology follow-up encounter     Elaine Moran is a very pleasant 82 year old white female has a history of bilateral breast cancers, and returns to the office today for an annual follow-up visit.        Clinically, she remains NED with regard to her history of breast cancer and has been feeling quite well without interval complaints or concerns other than chronic low back pain and worseing arthritis (recently unchanged).       Patient underwent CT scans at the first January 2024 that shown bilateral pleural effusions, atelectasis of adjacent left lung, scattered 2 mm punctuate nodular densities in the right lung with patient having shortness of breath.  Patient successfully completed thoracentesis with 800 mL of pleural effusion removed.  Cytology shown: Malignant cells present, metastatic poorly differentiated carcinoma.  PET performed on 03/16/2022 revealed multiple small mildly enlarged hypermetabolic intrathoracic and upper abdominal lymph nodes compatible with neoplasm.  Hypermetabolic left pleural nodules suspicious for metastases.  Moderate size left pleural effusion.  Hypermetabolic right adrenal nodule is suspicious for metastases.  Focus of hypermetabolic some of the lateral segment of the left hepatic lobe suspicious for metastases.    Faslodex injections initiated 04/29/2022.  Patient will initiate Ibrance on 04/30/2022.     She returns to the clinic today for labs and follow up prior to initial Faslodex injection.    Since last visit on 04/08/2022, she states she is feeling the best.  Patient reports being has became progressively worse with shortness of breath and wheezing. Upon exam, it is noted patient has possible left-sided pulmonary effusion.  Additionally, patient reports pain/discomfort localized to the base of her left lung and left flank.patient is accompanied at today's visit by her husband.  She states she is doing fairly well.  Patient reports her breathing has improved some since her thoracentesis.  Pt denies a recent history of bleeding, worsening nausea, emesis, worsening diarrhea, worsening headaches, vision changes, changes in gait, worsening edema, new sites of bone pain, joint pain, or any other concerning symptoms.    Hematologically, counts remain stable. Labs reviewed and discussed with the patient. White blood cell count is stable at 6.6, Red blood cell count 3.77, Hemaglobin 12.4, Hematocrit 37.8, and Platelets 233,000. Repeat iron studies and LFTs pending.     We reviewed and discussed with patient and spouse today that the treatment option of Faslodex injection and Ibrance oral chemotherapy per NCCN guidelines for reoccurrence of breast cancer.  Patient voices her concern in regards to treatment due to being unable to tolerate Arimidex previously.  Reviewed/discussed pros, cons, risk, benefits, and side effects. Additionally, discussed in the short-term patient will receive injection biweekly for 6 weeks and wants every month after.     Patient has agreed to the above treatment.  She does not want her care transferred to [facility] oncology. She has met with Elaine Plume, RN and Elaine Moran, CPP for education of treatment.     Prescriptions filled today: Zofran and Compazine for nausea.    Patient will undergo chest x-ray following visit.  No new or significant PE findings.      Detailed ROS below.    Return to clinic: Patient has been given a return appointment for 2 weeks for labs and reevaluation and Faslodex injection.    HPI: See Onc. History Below    Hematology/Oncology History Overview Note   Elaine Moran is a very pleasant 82 year old white female has a history of bilateral breast cancers, and returns to the office today for an annual follow-up visit.        Malignant neoplasm of upper-inner quadrant of right female breast (CMS-HCC)   01/18/2009 Initial Diagnosis    Malignant neoplasm of upper-inner quadrant of right female breast (CMS-HCC):  1)  Stage I (pTic, pN0, cM0) adenocarcinoma of the right breast:  Patient underwent a routine mammogram on 12/31/2008, which showed an area of architectural distortion in the RUIQ.  A U/S confirmed a 1.3 cm mass in the upper inner quadrant of the right breast.  An U/S guided biopsy (01/18/2009) confirmed a low grade IDC, ER +, PR +, her -2 neu 2 + (FISH negative).  Patient underwent a mastectomy with SLN sampling (Dr. Lawerance Bach III) on 02/12/2009, confirming a 1.3 cm low grade IDC, with no evidence of LVI or nodal disease ( 0/3 SLN were positive).  All surgical margins were negative, and there was no evidence of LVI. An Oncotype RS was 15 (low risk, with an ~ 10 % chance of recurrence over 10 years).  Post operatively, the patient received arimidex from 03/2009 through early 2012, when she was changed to tamoxifen due to increasing arthralgias and myalgias.  She completed the five years of estrogenic blockade with tamoxifen in early 04/2014.    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 Patient underwent a left radical mastectomy, with 0/15 LNs positive for metastases. Patient received five years of tamoxifen from April, 1991 through April, 1996.          Other    H/O bilateral breast cancers (stage I, ER + right breast cancer, 2010, and a previus h/o Stage IIA ER + left breast cancer, 1991.     The patient had d/c'd the tamoxifen early 04/2014, following completion of five years of therapy.       Review of Systems   Constitutional:  Positive for malaise/fatigue. Negative for chills, diaphoresis, fever and weight loss.   HENT: Negative.     Eyes: Negative.    Respiratory:  Positive for shortness of breath. Negative for cough, hemoptysis and wheezing.    Cardiovascular:  Positive for leg swelling. Negative for chest pain, palpitations, orthopnea, claudication and PND.   Gastrointestinal:  Negative for abdominal pain, blood in stool, constipation, diarrhea, heartburn, melena, nausea and vomiting.   Genitourinary:  Negative for dysuria, flank pain, frequency, hematuria and urgency.   Musculoskeletal:  Positive for myalgias. Negative for back pain, falls, joint pain and neck pain.   Skin:  Negative for itching and rash.   Neurological: Negative.    Endo/Heme/Allergies:  Bruises/bleeds easily.   Psychiatric/Behavioral: Negative.     All other systems reviewed and are negative.    Physical Exam  Vitals and nursing note reviewed. Exam conducted with a chaperone present.   Constitutional:       General: She is not in acute distress.     Appearance: She is well-developed. She is not diaphoretic.  Comments: Pleasant heavy set wf in NAD   HENT:      Head: Normocephalic and atraumatic.      Mouth/Throat:      Pharynx: No oropharyngeal exudate.   Eyes:      General: No scleral icterus.     Conjunctiva/sclera: Conjunctivae normal.      Pupils: Pupils are equal, round, and reactive to light.   Neck:      Thyroid: No thyromegaly.      Vascular: No JVD.   Cardiovascular:      Rate and Rhythm: Normal rate and regular rhythm. No extrasystoles are present.     Heart sounds: No murmur heard.  Pulmonary:      Effort: Pulmonary effort is normal. No respiratory distress.      Breath sounds: Decreased breath sounds (increased AP diameter with diffusely decreased BS) and wheezing (slight prolongation of expiration without audible wheezing) present. No rhonchi or rales.   Chest:      Chest wall: Deformity (anterior kyphosis) present. No tenderness.   Breasts:     Breasts are symmetrical.   Abdominal:      General: Bowel sounds are normal. There is no distension.      Palpations: Abdomen is soft. There is no mass.      Tenderness: There is no abdominal tenderness. There is no rebound.      Hernia: No hernia is present.   Musculoskeletal:         General: Normal range of motion.      Cervical back: Normal range of motion and neck supple.   Lymphadenopathy:      Head:      Right side of head: No submental, submandibular, preauricular, posterior auricular or occipital adenopathy.      Left side of head: No submental, submandibular, preauricular, posterior auricular or occipital adenopathy.      Cervical: No cervical adenopathy.      Upper Body:      Right upper body: No supraclavicular or epitrochlear adenopathy.      Left upper body: No supraclavicular or epitrochlear adenopathy.   Skin:     General: Skin is warm and dry.      Capillary Refill: Capillary refill takes less than 2 seconds.      Coloration: Skin is not pale.      Findings: No rash.      Nails: There is no clubbing.          Neurological:      General: No focal deficit present.      Mental Status: She is alert and oriented to person, place, and time.      Cranial Nerves: No cranial nerve deficit.   Psychiatric:         Attention and Perception: Attention and perception normal.         Mood and Affect: Mood and affect normal.         Speech: Speech normal.         Behavior: Behavior normal. Behavior is cooperative.         Thought Content: Thought content normal.         Cognition and Memory: Cognition and memory normal.       ECOG: 1 - Restricted and physically strenuous but ambulatory and able to carry out work of a light or secondary nature, e.g., light housework, office work    All questions from the patient and the family have been answered to their satisfaction.  We will  proceed as outlined within the plan.    Greater than 50% of this 44 minute visit was spent discussing coordination of care and treatment.    PERTINENT STUDIES / LABS / PATHOLOGY:  Appointment on 04/29/2022 Component Date Value Ref Range Status    Sodium 04/29/2022 145  128 - 145 mmol/L Final    Potassium 04/29/2022 4.3  3.6 - 5.1 mmol/L Final    Chloride 04/29/2022 106  98 - 108 mmol/L Final    CO2 04/29/2022 30.0  18.0 - 33.0 mmol/L Final    BUN 04/29/2022 27 (H)  7 - 22 mg/dL Final    Creatinine 08/65/7846 1.30 (H)  0.60 - 1.20 mg/dL Final    Glucose 96/29/5284 164 (H)  73 - 118 mg/dL Final    Calcium 13/24/4010 9.8  8.0 - 10.3 mg/dL Final    Albumin 27/25/3664 3.4  3.3 - 5.5 g/dL Final    Total Protein 04/29/2022 7.1  6.4 - 8.1 g/dL Final    Total Bilirubin 04/29/2022 0.6  0.2 - 1.6 mg/dL Final    AST 40/34/7425 32  11 - 38 U/L Final    ALT 04/29/2022 41  10 - 47 U/L Final    Alkaline Phosphatase 04/29/2022 70  42 - 141 U/L Final    CA 27-29 04/29/2022 24.3  <=38.6 U/mL Final    WBC 04/29/2022 6.6  4.0 - 11.0 10*9/L Final    RBC 04/29/2022 3.77  3.60 - 4.80 10*12/L Final    HGB 04/29/2022 12.4  12.0 - 16.0 g/dL Final    HCT 95/63/8756 37.8  36.0 - 48.0 % Final    MCV 04/29/2022 100.3 (H)  81.0 - 97.0 fL Final    MCH 04/29/2022 32.9  27.2 - 33.6 pg Final    MCHC 04/29/2022 32.8  31.6 - 35.2 g/dL Final    RDW 43/32/9518 13.6  11.3 - 15.2 % Final    MPV 04/29/2022 8.1 (L)  8.8 - 12.7 fL Final    Platelet 04/29/2022 233  146 - 400 10*9/L Final    Neutrophils % 04/29/2022 54.0  % Final    Immature Granulocytes Relative Per* 04/29/2022 0.2  % Final    Lymphocytes % 04/29/2022 35.2  % Final    Monocytes % 04/29/2022 6.8  % Final    Eosinophils % 04/29/2022 2.7  % Final    Basophils % 04/29/2022 1.1  % Final    Absolute Neutrophils 04/29/2022 3.6  1.4 - 5.7 10*9/L Final    Immature Granulocytes Absolute Cou* 04/29/2022 0.0  0.0 - 1.0 10*9/L Final    Absolute Lymphocytes 04/29/2022 2.3  1.0 - 3.7 10*9/L Final    Absolute Monocytes 04/29/2022 0.5  0.0 - 0.8 10*9/L Final    Absolute Eosinophils 04/29/2022 0.2  0.0 - 0.7 10*9/L Final    Absolute Basophils 04/29/2022 0.1  0.0 - 0.1 10*9/L Final     Past Medical History:  Past Medical History:   Diagnosis Date    Anemia     Iron deficiency    Arthritis     Osteoarthritis    Cancer (CMS-HCC)     Bilateral breast    Hypertension     Joint pain     Malignant neoplasm of upper-inner quadrant of right female breast (CMS-HCC) 01/18/2009    Rotator cuff tear     with bone spurs     Social History     Socioeconomic History    Marital status: Married   Tobacco Use  Smoking status: Never    Smokeless tobacco: Never   Substance and Sexual Activity    Alcohol use: No    Drug use: No     Anastrozole, Latex, Cortisone, Macrobid [nitrofurantoin monohyd/m-cryst], and Prednisone  Current Outpatient Medications   Medication Sig Dispense Refill    acetaminophen (TYLENOL) 650 MG CR tablet Take by mouth.      allopurinol (ZYLOPRIM) 100 MG tablet Take 100 mg by mouth daily.      aspirin (ECOTRIN) 81 MG tablet Take 1 tablet (81 mg total) by mouth daily.      diclofenac sodium (VOLTAREN) 1 % gel Apply topically.      doxepin (SINEQUAN) 10 MG capsule Take 10 mg by mouth nightly.      furosemide (LASIX) 40 MG tablet Take 1 tablet (40 mg total) by mouth daily. 30 tablet 11    latanoprost (XALATAN) 0.005 % ophthalmic solution Administer 1 drop to both eyes nightly.      lisinopril (PRINIVIL,ZESTRIL) 20 MG tablet Take 20 mg by mouth daily.      palbociclib (IBRANCE) 75 mg tablet Take 1 tablet (75 mg total) by mouth daily for 21 days, then stop for 7 days to complete a 28 days cycle.  Take with food. 21 tablet 11    prochlorperazine (COMPAZINE) 10 MG tablet Take 1 tablet (10 mg total) by mouth every six (6) hours as needed for nausea for up to 90 doses. 90 tablet 3     No current facility-administered medications for this visit.      Portions of the information in this record have been entered by Arna Medici acting as a scribe for Cira Servant, M.D.    The documentation recorded by the scribe accurately reflects the service I personally performed and the decisions made by me.  Charlynn Grimes, MD

## 2022-04-29 NOTE — Unmapped (Signed)
Patient presents at clinic for Faslodex injections. Injections administered, patient tolerated well. Patient educated on possible side effects. Patient discharged ambulatory

## 2022-04-30 ENCOUNTER — Ambulatory Visit: Admit: 2022-04-30 | Discharge: 2022-05-01 | Payer: MEDICARE

## 2022-04-30 DIAGNOSIS — J90 Pleural effusion, not elsewhere classified: Principal | ICD-10-CM

## 2022-04-30 MED ADMIN — lidocaine 1% with 8.4% sod bicarb syringe 10 mL: 10 mL | INTRADERMAL | @ 18:00:00 | Stop: 2022-04-30

## 2022-05-02 LAB — CANCER ANTIGEN 27.29: CA 27-29: 24.3 U/mL (ref <=38.6)

## 2022-05-04 DIAGNOSIS — Z9189 Other specified personal risk factors, not elsewhere classified: Principal | ICD-10-CM

## 2022-05-04 DIAGNOSIS — C50211 Malignant neoplasm of upper-inner quadrant of right female breast: Principal | ICD-10-CM

## 2022-05-04 MED ORDER — PROCHLORPERAZINE MALEATE 10 MG TABLET
ORAL_TABLET | Freq: Four times a day (QID) | ORAL | 3 refills | 15 days | Status: CP | PRN
Start: 2022-05-04 — End: ?

## 2022-05-11 NOTE — Unmapped (Signed)
Specialty Medication(s): Elaine Moran    Elaine Moran has been dis-enrolled from the [facility] specialty pharmacy services due to enrollment in a manufacturer assistance program that sends medicine directly to the patient.    Additional information provided to the patient:      [name] [name], PharmD  [facility] Specialty Pharmacist

## 2022-05-13 ENCOUNTER — Ambulatory Visit: Admit: 2022-05-13 | Discharge: 2022-05-14 | Payer: MEDICARE

## 2022-05-13 ENCOUNTER — Encounter
Admit: 2022-05-13 | Discharge: 2022-05-13 | Payer: MEDICARE | Attending: Nurse Practitioner | Primary: Nurse Practitioner

## 2022-05-13 ENCOUNTER — Encounter: Admit: 2022-05-13 | Discharge: 2022-05-13 | Payer: MEDICARE

## 2022-05-13 DIAGNOSIS — C50911 Malignant neoplasm of unspecified site of right female breast: Principal | ICD-10-CM

## 2022-05-13 DIAGNOSIS — C50912 Malignant neoplasm of unspecified site of left female breast: Principal | ICD-10-CM

## 2022-05-13 DIAGNOSIS — C50211 Malignant neoplasm of upper-inner quadrant of right female breast: Principal | ICD-10-CM

## 2022-05-13 DIAGNOSIS — Z79818 Long term (current) use of other agents affecting estrogen receptors and estrogen levels: Principal | ICD-10-CM

## 2022-05-13 DIAGNOSIS — C7802 Secondary malignant neoplasm of left lung: Principal | ICD-10-CM

## 2022-05-13 DIAGNOSIS — Z170 Estrogen receptor positive status [ER+]: Principal | ICD-10-CM

## 2022-05-13 MED ORDER — DUKE'S MAGIC MOUTHWASH + LIDOCAINE ORAL SUSPENSION PRDH
Freq: Four times a day (QID) | ORAL | 1 refills | 6 days | Status: CP | PRN
Start: 2022-05-13 — End: ?

## 2022-05-13 MED ORDER — CLOTRIMAZOLE 10 MG TROCHE
Freq: Four times a day (QID) | ORAL | 0 refills | 0 days | Status: CP
Start: 2022-05-13 — End: ?

## 2022-05-14 DIAGNOSIS — R0602 Shortness of breath: Principal | ICD-10-CM

## 2022-05-22 DIAGNOSIS — C50211 Malignant neoplasm of upper-inner quadrant of right female breast: Principal | ICD-10-CM

## 2022-05-25 DIAGNOSIS — C50211 Malignant neoplasm of upper-inner quadrant of right female breast: Principal | ICD-10-CM

## 2022-05-27 ENCOUNTER — Ambulatory Visit: Admit: 2022-05-27 | Discharge: 2022-05-27 | Payer: MEDICARE

## 2022-05-27 ENCOUNTER — Encounter: Admit: 2022-05-27 | Discharge: 2022-05-27 | Payer: MEDICARE

## 2022-05-27 DIAGNOSIS — C50911 Malignant neoplasm of unspecified site of right female breast: Principal | ICD-10-CM

## 2022-05-27 DIAGNOSIS — C50211 Malignant neoplasm of upper-inner quadrant of right female breast: Principal | ICD-10-CM

## 2022-06-19 DIAGNOSIS — C50211 Malignant neoplasm of upper-inner quadrant of right female breast: Principal | ICD-10-CM

## 2022-06-22 DIAGNOSIS — R0602 Shortness of breath: Principal | ICD-10-CM

## 2022-06-22 DIAGNOSIS — C50211 Malignant neoplasm of upper-inner quadrant of right female breast: Principal | ICD-10-CM

## 2022-06-24 ENCOUNTER — Encounter
Admit: 2022-06-24 | Discharge: 2022-06-24 | Payer: MEDICARE | Attending: Nurse Practitioner | Primary: Nurse Practitioner

## 2022-06-24 ENCOUNTER — Encounter: Admit: 2022-06-24 | Discharge: 2022-06-24 | Payer: MEDICARE

## 2022-06-24 ENCOUNTER — Ambulatory Visit: Admit: 2022-06-24 | Discharge: 2022-06-24 | Payer: MEDICARE

## 2022-06-24 DIAGNOSIS — C50211 Malignant neoplasm of upper-inner quadrant of right female breast: Principal | ICD-10-CM

## 2022-06-24 DIAGNOSIS — Z79818 Long term (current) use of other agents affecting estrogen receptors and estrogen levels: Principal | ICD-10-CM

## 2022-06-24 DIAGNOSIS — Z01818 Encounter for other preprocedural examination: Principal | ICD-10-CM

## 2022-06-24 DIAGNOSIS — C50911 Malignant neoplasm of unspecified site of right female breast: Principal | ICD-10-CM

## 2022-06-24 DIAGNOSIS — C50919 Malignant neoplasm of unspecified site of unspecified female breast: Principal | ICD-10-CM

## 2022-06-24 DIAGNOSIS — J90 Pleural effusion, not elsewhere classified: Principal | ICD-10-CM

## 2022-06-24 DIAGNOSIS — C7800 Secondary malignant neoplasm of unspecified lung: Principal | ICD-10-CM

## 2022-07-22 ENCOUNTER — Ambulatory Visit: Admit: 2022-07-22 | Discharge: 2022-07-22 | Payer: MEDICARE

## 2022-07-22 ENCOUNTER — Encounter: Admit: 2022-07-22 | Discharge: 2022-07-22 | Payer: MEDICARE

## 2022-07-22 DIAGNOSIS — C50211 Malignant neoplasm of upper-inner quadrant of right female breast: Principal | ICD-10-CM

## 2022-07-22 DIAGNOSIS — C50911 Malignant neoplasm of unspecified site of right female breast: Principal | ICD-10-CM

## 2022-07-24 IMAGING — US US EXTREM UP *R* LTD
1 series · 15 of 25 positions shown · non-contrast
Comparison: None.

CLINICAL DATA: Right wrist and hand swelling with pain. To evaluate
for abscess. IV of the right hand.

EXAM:
ULTRASOUND right UPPER EXTREMITY LIMITED
TECHNIQUE: Ultrasound examination of the upper extremity soft tissues was
performed in the area of clinical concern.

[Series 1: us extrem low bilat ltd mc & wl · 35 acquisitions, 15 frames shown]
[im 1/35]
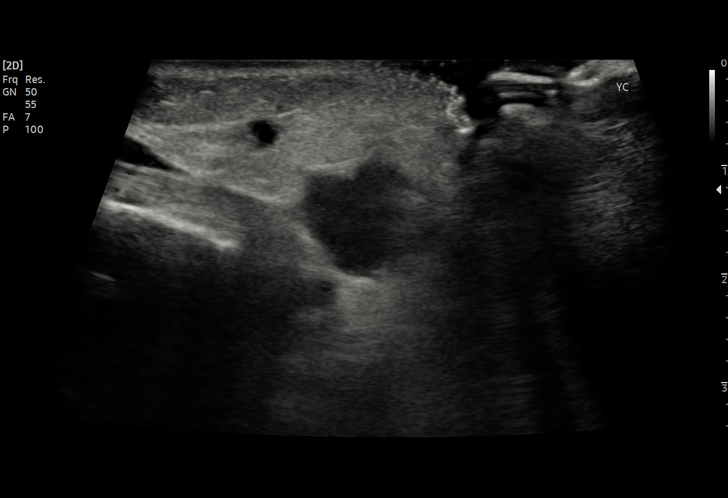
[im 3/35]
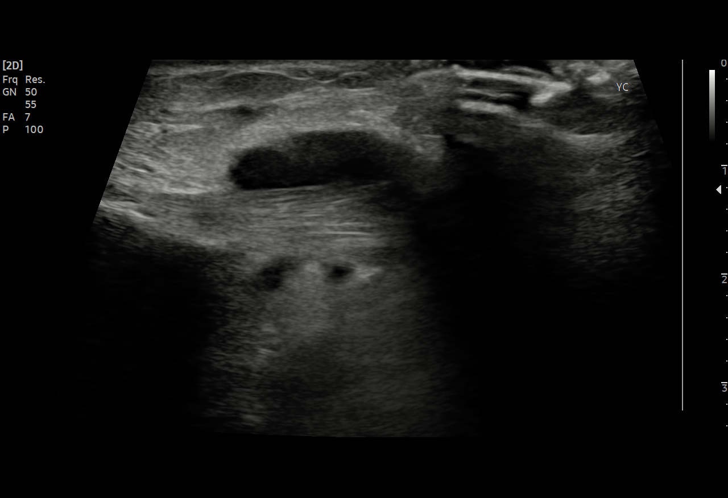
[im 6/35]
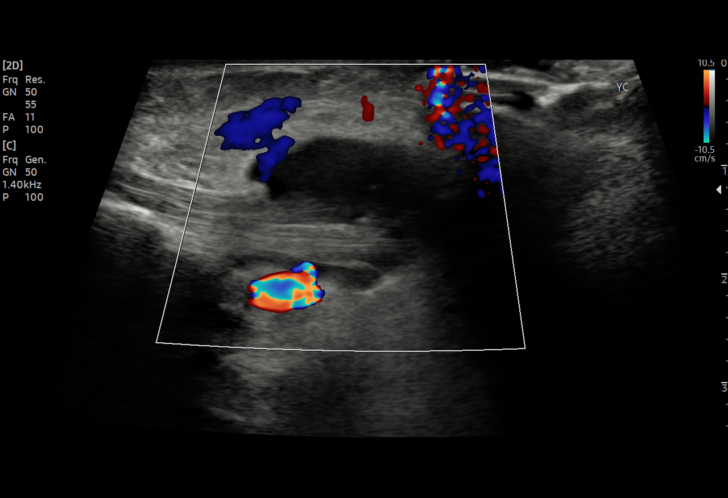
[im 8/35]
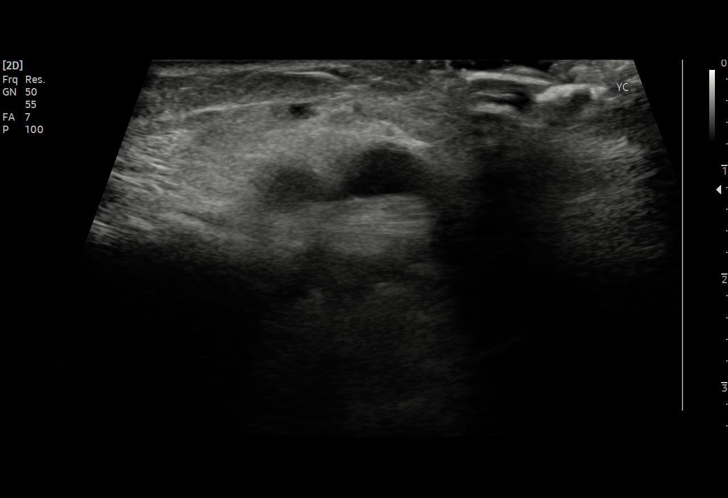
[im 10/35]
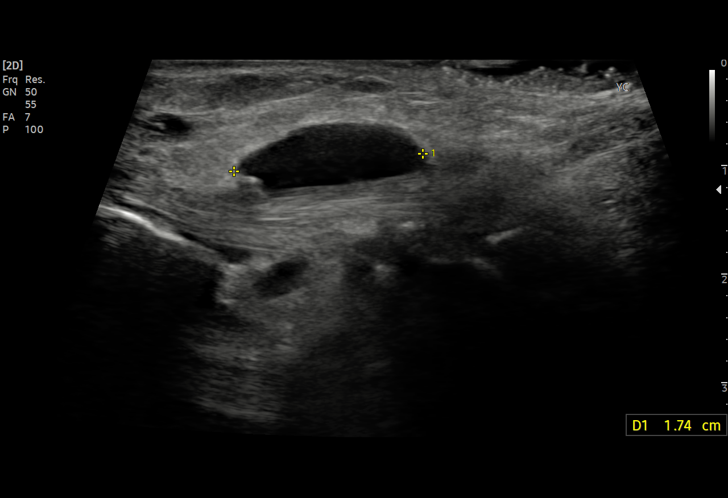
[im 13/35]
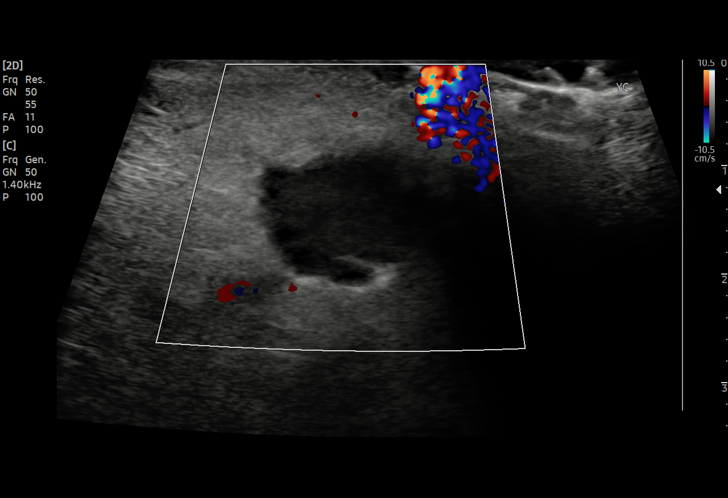
[im 15/35]
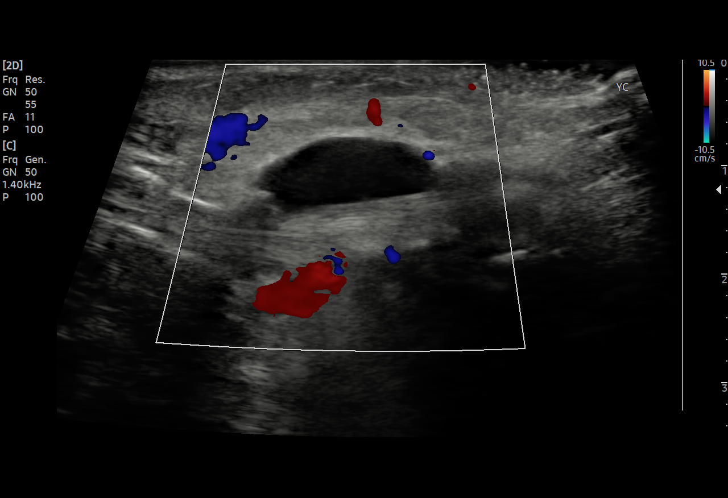
[im 18/35]
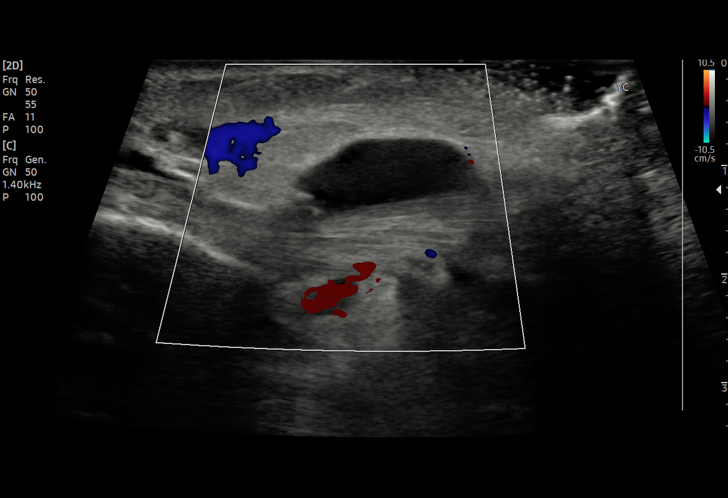
[im 20/35]
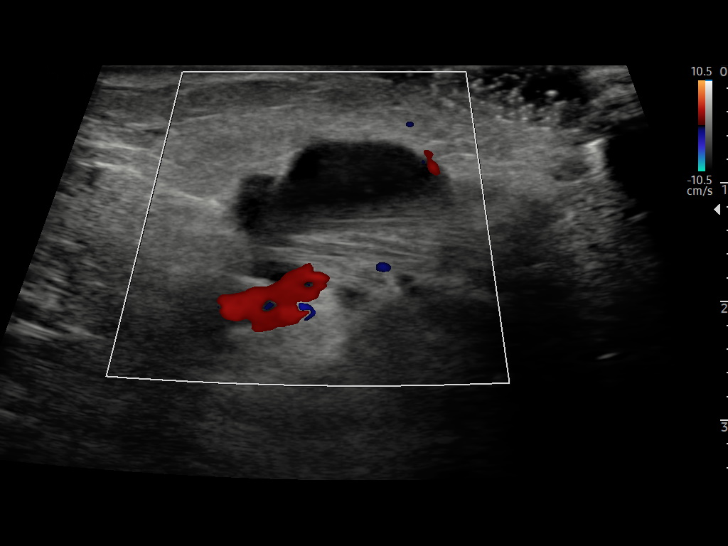
[im 22/35]
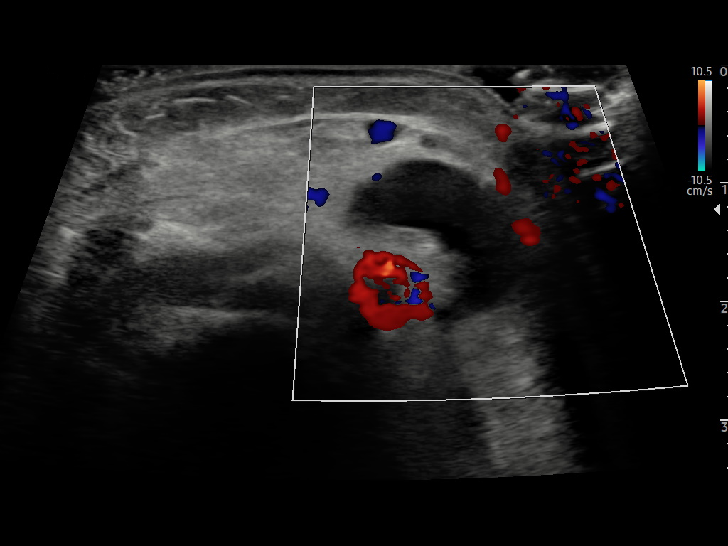
[im 25/35]
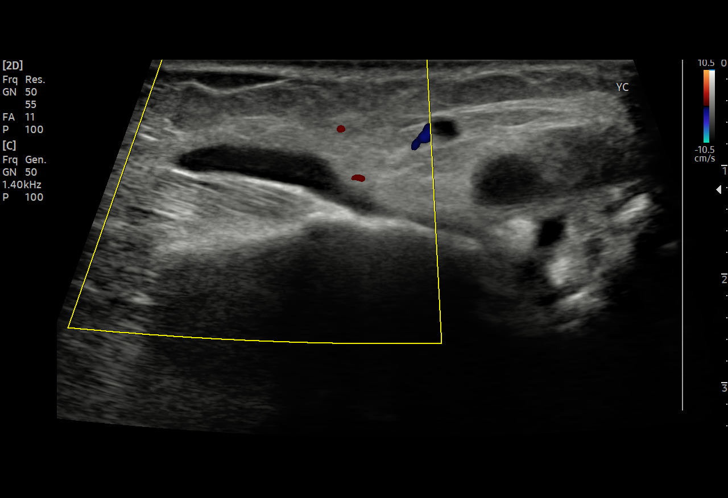
[im 27/35]
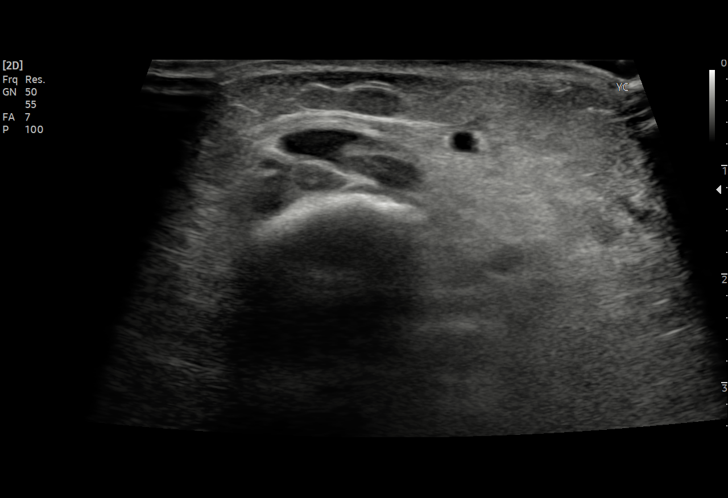
[im 29/35]
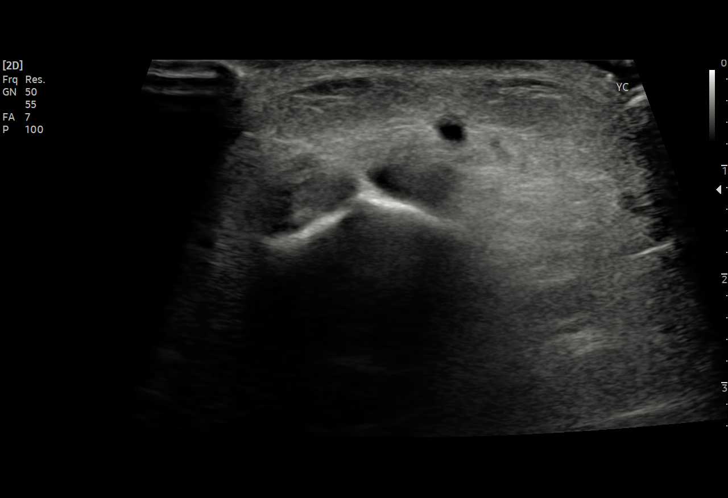
[im 32/35]
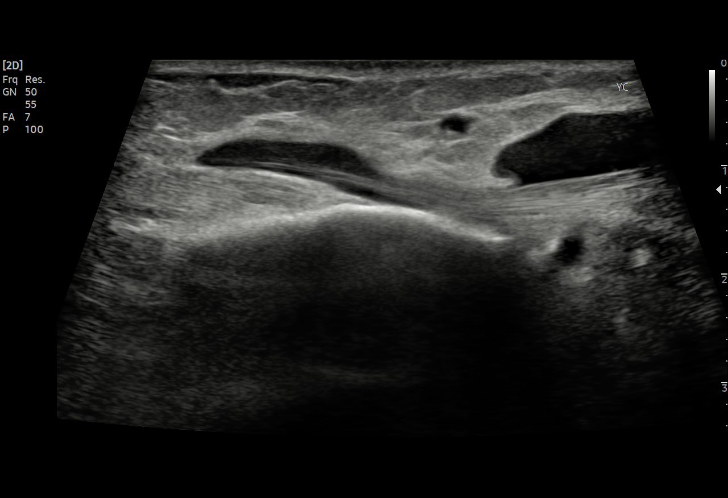
[im 35/35]
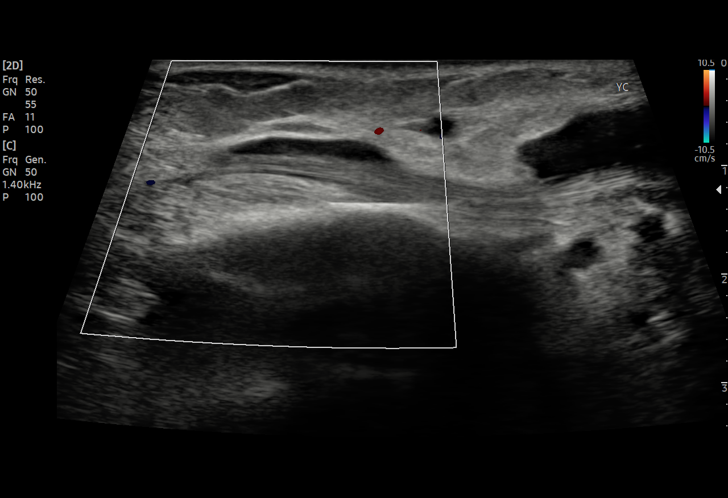

[15 of 25 positions shown; findings below may reference images not displayed]

FINDINGS: Localized ultrasound examination of the soft tissues over the dorsal
aspect of the right hand on the radial side. Two small collections
are demonstrated, 1 measuring 1.7 x 1.3 x 1.8 cm in the other 1.7 x
0.4 x 1.1 cm. The collections are circumscribed without internal
debris. Collections appear to be adjacent to ligaments. Appearance
is most likely to represent tenosynovitis. Ganglion cyst or abscess
would be possible but less likely considerations.
IMPRESSION: Two loculated collections along the dorsum of the right hand at the
radial side may indicate tenosynovitis. Ganglion cyst or abscess
would be alternative possibilities.

## 2022-08-18 ENCOUNTER — Encounter
Admit: 2022-08-18 | Discharge: 2022-08-18 | Payer: MEDICARE | Attending: Nurse Practitioner | Primary: Nurse Practitioner

## 2022-08-18 ENCOUNTER — Ambulatory Visit: Admit: 2022-08-18 | Discharge: 2022-08-18 | Payer: MEDICARE

## 2022-08-18 DIAGNOSIS — C50211 Malignant neoplasm of upper-inner quadrant of right female breast: Principal | ICD-10-CM

## 2022-08-18 DIAGNOSIS — C50911 Malignant neoplasm of unspecified site of right female breast: Principal | ICD-10-CM

## 2022-09-15 ENCOUNTER — Ambulatory Visit: Admit: 2022-09-15 | Discharge: 2022-09-15 | Payer: MEDICARE

## 2022-09-15 ENCOUNTER — Encounter: Admit: 2022-09-15 | Discharge: 2022-09-15 | Payer: MEDICARE

## 2022-09-15 DIAGNOSIS — C50211 Malignant neoplasm of upper-inner quadrant of right female breast: Principal | ICD-10-CM

## 2022-09-15 DIAGNOSIS — C7800 Secondary malignant neoplasm of unspecified lung: Principal | ICD-10-CM

## 2022-09-15 DIAGNOSIS — C50919 Malignant neoplasm of unspecified site of unspecified female breast: Principal | ICD-10-CM

## 2022-09-15 DIAGNOSIS — J90 Pleural effusion, not elsewhere classified: Principal | ICD-10-CM

## 2022-09-15 DIAGNOSIS — C50911 Malignant neoplasm of unspecified site of right female breast: Principal | ICD-10-CM

## 2022-09-15 DIAGNOSIS — Z01818 Encounter for other preprocedural examination: Principal | ICD-10-CM

## 2022-09-15 DIAGNOSIS — Z170 Estrogen receptor positive status [ER+]: Principal | ICD-10-CM

## 2022-09-15 DIAGNOSIS — Z79818 Long term (current) use of other agents affecting estrogen receptors and estrogen levels: Principal | ICD-10-CM

## 2022-09-20 DIAGNOSIS — C50211 Malignant neoplasm of upper-inner quadrant of right female breast: Principal | ICD-10-CM

## 2022-10-13 ENCOUNTER — Ambulatory Visit: Admit: 2022-10-13 | Discharge: 2022-10-13 | Payer: MEDICARE

## 2022-10-13 ENCOUNTER — Ambulatory Visit: Admit: 2022-10-13 | Discharge: 2022-10-14 | Payer: MEDICARE

## 2022-10-13 ENCOUNTER — Encounter: Admit: 2022-10-13 | Discharge: 2022-10-14 | Payer: MEDICARE

## 2022-10-13 DIAGNOSIS — C50911 Malignant neoplasm of unspecified site of right female breast: Principal | ICD-10-CM

## 2022-10-13 DIAGNOSIS — J90 Pleural effusion, not elsewhere classified: Principal | ICD-10-CM

## 2022-10-13 DIAGNOSIS — Z170 Estrogen receptor positive status [ER+]: Principal | ICD-10-CM

## 2022-10-13 DIAGNOSIS — C7800 Secondary malignant neoplasm of unspecified lung: Principal | ICD-10-CM

## 2022-10-13 DIAGNOSIS — C50211 Malignant neoplasm of upper-inner quadrant of right female breast: Principal | ICD-10-CM

## 2022-10-13 DIAGNOSIS — Z79818 Long term (current) use of other agents affecting estrogen receptors and estrogen levels: Principal | ICD-10-CM

## 2022-10-13 DIAGNOSIS — D0220 Carcinoma in situ of unspecified bronchus and lung: Principal | ICD-10-CM

## 2022-10-13 DIAGNOSIS — Z01818 Encounter for other preprocedural examination: Principal | ICD-10-CM

## 2022-10-13 DIAGNOSIS — C50919 Malignant neoplasm of unspecified site of unspecified female breast: Principal | ICD-10-CM

## 2022-10-18 DIAGNOSIS — C50211 Malignant neoplasm of upper-inner quadrant of right female breast: Principal | ICD-10-CM

## 2022-10-20 DIAGNOSIS — J90 Pleural effusion, not elsewhere classified: Principal | ICD-10-CM

## 2022-10-22 ENCOUNTER — Ambulatory Visit: Admit: 2022-10-22 | Discharge: 2022-10-23 | Payer: MEDICARE

## 2022-10-22 DIAGNOSIS — J90 Pleural effusion, not elsewhere classified: Principal | ICD-10-CM

## 2022-11-12 ENCOUNTER — Encounter: Admit: 2022-11-12 | Discharge: 2022-11-12 | Payer: MEDICARE

## 2022-11-12 ENCOUNTER — Ambulatory Visit: Admit: 2022-11-12 | Discharge: 2022-11-12 | Payer: MEDICARE

## 2022-11-12 DIAGNOSIS — C50911 Malignant neoplasm of unspecified site of right female breast: Principal | ICD-10-CM

## 2022-11-12 DIAGNOSIS — Z79818 Long term (current) use of other agents affecting estrogen receptors and estrogen levels: Principal | ICD-10-CM

## 2022-11-12 DIAGNOSIS — Z01818 Encounter for other preprocedural examination: Principal | ICD-10-CM

## 2022-11-12 DIAGNOSIS — C50211 Malignant neoplasm of upper-inner quadrant of right female breast: Principal | ICD-10-CM

## 2022-11-12 DIAGNOSIS — C50919 Malignant neoplasm of unspecified site of unspecified female breast: Principal | ICD-10-CM

## 2022-11-12 DIAGNOSIS — Z8709 Personal history of other diseases of the respiratory system: Principal | ICD-10-CM

## 2022-11-12 DIAGNOSIS — C7800 Secondary malignant neoplasm of unspecified lung: Principal | ICD-10-CM

## 2022-11-12 DIAGNOSIS — Z170 Estrogen receptor positive status [ER+]: Principal | ICD-10-CM

## 2022-11-17 DIAGNOSIS — C50211 Malignant neoplasm of upper-inner quadrant of right female breast: Principal | ICD-10-CM

## 2022-12-10 ENCOUNTER — Ambulatory Visit: Admit: 2022-12-10 | Discharge: 2022-12-10 | Payer: MEDICARE

## 2022-12-10 ENCOUNTER — Encounter: Admit: 2022-12-10 | Discharge: 2022-12-10 | Payer: MEDICARE

## 2022-12-10 DIAGNOSIS — Z8709 Personal history of other diseases of the respiratory system: Principal | ICD-10-CM

## 2022-12-10 DIAGNOSIS — J90 Pleural effusion, not elsewhere classified: Principal | ICD-10-CM

## 2022-12-10 DIAGNOSIS — C50211 Malignant neoplasm of upper-inner quadrant of right female breast: Principal | ICD-10-CM

## 2022-12-10 DIAGNOSIS — C50919 Malignant neoplasm of unspecified site of unspecified female breast: Principal | ICD-10-CM

## 2022-12-10 DIAGNOSIS — C7800 Secondary malignant neoplasm of unspecified lung: Principal | ICD-10-CM

## 2022-12-10 DIAGNOSIS — C50911 Malignant neoplasm of unspecified site of right female breast: Principal | ICD-10-CM

## 2022-12-10 DIAGNOSIS — Z170 Estrogen receptor positive status [ER+]: Principal | ICD-10-CM

## 2022-12-10 DIAGNOSIS — Z79818 Long term (current) use of other agents affecting estrogen receptors and estrogen levels: Principal | ICD-10-CM

## 2022-12-10 DIAGNOSIS — Z01818 Encounter for other preprocedural examination: Principal | ICD-10-CM

## 2022-12-24 DIAGNOSIS — C50211 Malignant neoplasm of upper-inner quadrant of right female breast: Principal | ICD-10-CM

## 2023-01-19 ENCOUNTER — Ambulatory Visit: Admit: 2023-01-19 | Discharge: 2023-01-19 | Payer: MEDICARE

## 2023-01-19 ENCOUNTER — Encounter: Admit: 2023-01-19 | Discharge: 2023-01-19 | Payer: MEDICARE

## 2023-01-19 DIAGNOSIS — C50211 Malignant neoplasm of upper-inner quadrant of right female breast: Principal | ICD-10-CM

## 2023-01-19 DIAGNOSIS — Z79818 Long term (current) use of other agents affecting estrogen receptors and estrogen levels: Principal | ICD-10-CM

## 2023-01-19 DIAGNOSIS — C7800 Secondary malignant neoplasm of unspecified lung: Principal | ICD-10-CM

## 2023-01-19 DIAGNOSIS — C50911 Malignant neoplasm of unspecified site of right female breast: Principal | ICD-10-CM

## 2023-01-19 DIAGNOSIS — C50919 Malignant neoplasm of unspecified site of unspecified female breast: Principal | ICD-10-CM

## 2023-01-19 DIAGNOSIS — J90 Pleural effusion, not elsewhere classified: Principal | ICD-10-CM

## 2023-01-19 DIAGNOSIS — Z01818 Encounter for other preprocedural examination: Principal | ICD-10-CM

## 2023-01-19 DIAGNOSIS — Z8709 Personal history of other diseases of the respiratory system: Principal | ICD-10-CM

## 2023-01-19 MED ORDER — PALBOCICLIB 75 MG TABLET
ORAL_TABLET | 11 refills | 0 days
Start: 2023-01-19 — End: ?

## 2023-01-20 DIAGNOSIS — C50211 Malignant neoplasm of upper-inner quadrant of right female breast: Principal | ICD-10-CM

## 2023-01-20 MED ORDER — PALBOCICLIB 75 MG TABLET
ORAL_TABLET | 11 refills | 0 days | Status: CP
Start: 2023-01-20 — End: ?

## 2023-01-21 DIAGNOSIS — C50211 Malignant neoplasm of upper-inner quadrant of right female breast: Principal | ICD-10-CM

## 2023-02-16 ENCOUNTER — Ambulatory Visit: Admit: 2023-02-16 | Discharge: 2023-02-16 | Payer: MEDICARE

## 2023-02-16 ENCOUNTER — Encounter: Admit: 2023-02-16 | Discharge: 2023-02-16 | Payer: MEDICARE

## 2023-02-16 DIAGNOSIS — C50919 Malignant neoplasm of unspecified site of unspecified female breast: Principal | ICD-10-CM

## 2023-02-16 DIAGNOSIS — C50211 Malignant neoplasm of upper-inner quadrant of right female breast: Principal | ICD-10-CM

## 2023-02-16 DIAGNOSIS — C7800 Secondary malignant neoplasm of unspecified lung: Principal | ICD-10-CM

## 2023-02-16 DIAGNOSIS — C50911 Malignant neoplasm of unspecified site of right female breast: Principal | ICD-10-CM

## 2023-02-16 DIAGNOSIS — Z01818 Encounter for other preprocedural examination: Principal | ICD-10-CM

## 2023-02-16 DIAGNOSIS — Z79818 Long term (current) use of other agents affecting estrogen receptors and estrogen levels: Principal | ICD-10-CM

## 2023-02-18 DIAGNOSIS — C50211 Malignant neoplasm of upper-inner quadrant of right female breast: Principal | ICD-10-CM

## 2023-03-17 ENCOUNTER — Ambulatory Visit: Admit: 2023-03-17 | Discharge: 2023-03-17 | Payer: MEDICARE

## 2023-03-17 ENCOUNTER — Encounter: Admit: 2023-03-17 | Discharge: 2023-03-17 | Payer: MEDICARE

## 2023-03-17 DIAGNOSIS — C50211 Malignant neoplasm of upper-inner quadrant of right female breast: Principal | ICD-10-CM

## 2023-03-18 DIAGNOSIS — C50211 Malignant neoplasm of upper-inner quadrant of right female breast: Principal | ICD-10-CM

## 2023-03-24 DIAGNOSIS — C50211 Malignant neoplasm of upper-inner quadrant of right female breast: Principal | ICD-10-CM

## 2023-04-09 DIAGNOSIS — C50211 Malignant neoplasm of upper-inner quadrant of right female breast: Principal | ICD-10-CM

## 2023-04-14 ENCOUNTER — Ambulatory Visit: Admit: 2023-04-14 | Discharge: 2023-04-14 | Payer: MEDICARE

## 2023-04-14 ENCOUNTER — Encounter: Admit: 2023-04-14 | Discharge: 2023-04-14 | Payer: MEDICARE

## 2023-04-14 DIAGNOSIS — C50211 Malignant neoplasm of upper-inner quadrant of right female breast: Principal | ICD-10-CM

## 2023-04-15 DIAGNOSIS — C50211 Malignant neoplasm of upper-inner quadrant of right female breast: Principal | ICD-10-CM

## 2023-04-20 DIAGNOSIS — C50211 Malignant neoplasm of upper-inner quadrant of right female breast: Principal | ICD-10-CM

## 2023-05-11 ENCOUNTER — Encounter: Admit: 2023-05-11 | Discharge: 2023-05-11 | Payer: MEDICARE

## 2023-05-11 ENCOUNTER — Ambulatory Visit: Admit: 2023-05-11 | Discharge: 2023-05-11 | Payer: MEDICARE

## 2023-05-11 DIAGNOSIS — C50211 Malignant neoplasm of upper-inner quadrant of right female breast: Principal | ICD-10-CM

## 2023-05-12 DIAGNOSIS — C50211 Malignant neoplasm of upper-inner quadrant of right female breast: Principal | ICD-10-CM

## 2023-05-13 DIAGNOSIS — C50211 Malignant neoplasm of upper-inner quadrant of right female breast: Principal | ICD-10-CM

## 2023-05-24 DIAGNOSIS — C50211 Malignant neoplasm of upper-inner quadrant of right female breast: Principal | ICD-10-CM

## 2023-06-08 ENCOUNTER — Ambulatory Visit: Admit: 2023-06-08 | Discharge: 2023-06-08 | Payer: MEDICARE

## 2023-06-08 ENCOUNTER — Encounter: Admit: 2023-06-08 | Discharge: 2023-06-08 | Payer: MEDICARE

## 2023-06-08 DIAGNOSIS — Z79818 Long term (current) use of other agents affecting estrogen receptors and estrogen levels: Principal | ICD-10-CM

## 2023-06-08 DIAGNOSIS — E538 Deficiency of other specified B group vitamins: Principal | ICD-10-CM

## 2023-06-08 DIAGNOSIS — C50211 Malignant neoplasm of upper-inner quadrant of right female breast: Principal | ICD-10-CM

## 2023-06-08 DIAGNOSIS — C50919 Malignant neoplasm of unspecified site of unspecified female breast: Principal | ICD-10-CM

## 2023-06-08 DIAGNOSIS — C7800 Secondary malignant neoplasm of unspecified lung: Principal | ICD-10-CM

## 2023-06-08 DIAGNOSIS — C50911 Malignant neoplasm of unspecified site of right female breast: Principal | ICD-10-CM

## 2023-06-08 DIAGNOSIS — Z8709 Personal history of other diseases of the respiratory system: Principal | ICD-10-CM

## 2023-06-08 DIAGNOSIS — Z01818 Encounter for other preprocedural examination: Principal | ICD-10-CM

## 2023-06-09 DIAGNOSIS — C50211 Malignant neoplasm of upper-inner quadrant of right female breast: Principal | ICD-10-CM

## 2023-06-16 DIAGNOSIS — C50211 Malignant neoplasm of upper-inner quadrant of right female breast: Principal | ICD-10-CM

## 2023-06-20 DIAGNOSIS — E538 Deficiency of other specified B group vitamins: Principal | ICD-10-CM

## 2023-06-20 DIAGNOSIS — C50211 Malignant neoplasm of upper-inner quadrant of right female breast: Principal | ICD-10-CM

## 2023-07-06 ENCOUNTER — Encounter: Admit: 2023-07-06 | Discharge: 2023-07-06 | Payer: MEDICARE

## 2023-07-06 ENCOUNTER — Ambulatory Visit: Admit: 2023-07-06 | Discharge: 2023-07-06 | Payer: MEDICARE

## 2023-07-06 DIAGNOSIS — C50919 Malignant neoplasm of unspecified site of unspecified female breast: Principal | ICD-10-CM

## 2023-07-06 DIAGNOSIS — C7800 Secondary malignant neoplasm of unspecified lung: Principal | ICD-10-CM

## 2023-07-06 DIAGNOSIS — C50911 Malignant neoplasm of unspecified site of right female breast: Principal | ICD-10-CM

## 2023-07-06 DIAGNOSIS — C50211 Malignant neoplasm of upper-inner quadrant of right female breast: Principal | ICD-10-CM

## 2023-07-06 DIAGNOSIS — J90 Pleural effusion, not elsewhere classified: Principal | ICD-10-CM

## 2023-07-07 DIAGNOSIS — C50211 Malignant neoplasm of upper-inner quadrant of right female breast: Principal | ICD-10-CM

## 2023-07-19 DIAGNOSIS — E538 Deficiency of other specified B group vitamins: Principal | ICD-10-CM

## 2023-07-19 DIAGNOSIS — C50211 Malignant neoplasm of upper-inner quadrant of right female breast: Principal | ICD-10-CM

## 2023-08-03 ENCOUNTER — Encounter: Admit: 2023-08-03 | Discharge: 2023-08-03 | Payer: MEDICARE

## 2023-08-03 ENCOUNTER — Ambulatory Visit: Admit: 2023-08-03 | Discharge: 2023-08-03 | Payer: MEDICARE

## 2023-08-03 DIAGNOSIS — C50211 Malignant neoplasm of upper-inner quadrant of right female breast: Principal | ICD-10-CM

## 2023-08-03 DIAGNOSIS — C50911 Malignant neoplasm of unspecified site of right female breast: Principal | ICD-10-CM

## 2023-08-03 DIAGNOSIS — Z79818 Long term (current) use of other agents affecting estrogen receptors and estrogen levels: Principal | ICD-10-CM

## 2023-08-03 DIAGNOSIS — C50919 Malignant neoplasm of unspecified site of unspecified female breast: Principal | ICD-10-CM

## 2023-08-03 DIAGNOSIS — C7800 Secondary malignant neoplasm of unspecified lung: Principal | ICD-10-CM

## 2023-08-22 DIAGNOSIS — C50211 Malignant neoplasm of upper-inner quadrant of right female breast: Principal | ICD-10-CM

## 2023-08-24 DIAGNOSIS — C50211 Malignant neoplasm of upper-inner quadrant of right female breast: Principal | ICD-10-CM

## 2023-08-25 DIAGNOSIS — C50211 Malignant neoplasm of upper-inner quadrant of right female breast: Principal | ICD-10-CM

## 2023-09-07 ENCOUNTER — Encounter: Admit: 2023-09-07 | Discharge: 2023-09-07 | Payer: MEDICARE

## 2023-09-07 ENCOUNTER — Inpatient Hospital Stay: Admit: 2023-09-07 | Discharge: 2023-09-07 | Payer: MEDICARE

## 2023-09-07 ENCOUNTER — Ambulatory Visit: Admit: 2023-09-07 | Discharge: 2023-09-07 | Payer: MEDICARE

## 2023-09-07 DIAGNOSIS — C50211 Malignant neoplasm of upper-inner quadrant of right female breast: Principal | ICD-10-CM

## 2023-09-07 DIAGNOSIS — E538 Deficiency of other specified B group vitamins: Principal | ICD-10-CM

## 2023-09-07 DIAGNOSIS — Z79818 Long term (current) use of other agents affecting estrogen receptors and estrogen levels: Principal | ICD-10-CM

## 2023-09-07 DIAGNOSIS — C7800 Secondary malignant neoplasm of unspecified lung: Principal | ICD-10-CM

## 2023-09-07 DIAGNOSIS — C50911 Malignant neoplasm of unspecified site of right female breast: Principal | ICD-10-CM

## 2023-09-07 DIAGNOSIS — C50919 Malignant neoplasm of unspecified site of unspecified female breast: Principal | ICD-10-CM

## 2023-09-08 DIAGNOSIS — C50211 Malignant neoplasm of upper-inner quadrant of right female breast: Principal | ICD-10-CM

## 2023-09-11 DIAGNOSIS — C50211 Malignant neoplasm of upper-inner quadrant of right female breast: Principal | ICD-10-CM

## 2023-09-19 DIAGNOSIS — C50211 Malignant neoplasm of upper-inner quadrant of right female breast: Principal | ICD-10-CM

## 2023-10-03 DIAGNOSIS — C50211 Malignant neoplasm of upper-inner quadrant of right female breast: Principal | ICD-10-CM

## 2023-10-12 ENCOUNTER — Encounter: Admit: 2023-10-12 | Discharge: 2023-10-12 | Payer: MEDICARE

## 2023-10-12 ENCOUNTER — Ambulatory Visit: Admit: 2023-10-12 | Discharge: 2023-10-12 | Payer: MEDICARE

## 2023-10-12 DIAGNOSIS — C50211 Malignant neoplasm of upper-inner quadrant of right female breast: Principal | ICD-10-CM

## 2023-10-12 DIAGNOSIS — Z79818 Long term (current) use of other agents affecting estrogen receptors and estrogen levels: Principal | ICD-10-CM

## 2023-10-12 DIAGNOSIS — C50919 Malignant neoplasm of unspecified site of unspecified female breast: Principal | ICD-10-CM

## 2023-10-12 DIAGNOSIS — C7800 Secondary malignant neoplasm of unspecified lung: Principal | ICD-10-CM

## 2023-10-12 DIAGNOSIS — C50911 Malignant neoplasm of unspecified site of right female breast: Principal | ICD-10-CM

## 2023-10-13 DIAGNOSIS — C50211 Malignant neoplasm of upper-inner quadrant of right female breast: Principal | ICD-10-CM

## 2023-10-31 DIAGNOSIS — C50211 Malignant neoplasm of upper-inner quadrant of right female breast: Principal | ICD-10-CM

## 2023-11-05 DIAGNOSIS — C50211 Malignant neoplasm of upper-inner quadrant of right female breast: Principal | ICD-10-CM

## 2023-11-09 ENCOUNTER — Ambulatory Visit: Admit: 2023-11-09 | Discharge: 2023-11-09 | Payer: MEDICARE

## 2023-11-09 ENCOUNTER — Inpatient Hospital Stay: Admit: 2023-11-09 | Discharge: 2023-11-09 | Payer: MEDICARE

## 2023-11-09 ENCOUNTER — Encounter: Admit: 2023-11-09 | Discharge: 2023-11-09 | Payer: MEDICARE

## 2023-11-09 DIAGNOSIS — C50211 Malignant neoplasm of upper-inner quadrant of right female breast: Principal | ICD-10-CM

## 2023-11-09 DIAGNOSIS — C7800 Secondary malignant neoplasm of unspecified lung: Principal | ICD-10-CM

## 2023-11-09 DIAGNOSIS — Z79818 Long term (current) use of other agents affecting estrogen receptors and estrogen levels: Principal | ICD-10-CM

## 2023-11-09 DIAGNOSIS — C50911 Malignant neoplasm of unspecified site of right female breast: Principal | ICD-10-CM

## 2023-11-10 DIAGNOSIS — C50211 Malignant neoplasm of upper-inner quadrant of right female breast: Principal | ICD-10-CM

## 2023-11-28 DIAGNOSIS — C50211 Malignant neoplasm of upper-inner quadrant of right female breast: Principal | ICD-10-CM

## 2023-12-06 DIAGNOSIS — C50211 Malignant neoplasm of upper-inner quadrant of right female breast: Principal | ICD-10-CM

## 2023-12-07 ENCOUNTER — Ambulatory Visit: Admit: 2023-12-07 | Discharge: 2023-12-07 | Payer: MEDICARE

## 2023-12-07 ENCOUNTER — Encounter: Admit: 2023-12-07 | Discharge: 2023-12-07 | Payer: MEDICARE

## 2023-12-07 DIAGNOSIS — C50211 Malignant neoplasm of upper-inner quadrant of right female breast: Principal | ICD-10-CM

## 2023-12-07 DIAGNOSIS — C7800 Secondary malignant neoplasm of unspecified lung: Principal | ICD-10-CM

## 2023-12-07 DIAGNOSIS — C50911 Malignant neoplasm of unspecified site of right female breast: Principal | ICD-10-CM

## 2023-12-07 DIAGNOSIS — C50919 Malignant neoplasm of unspecified site of unspecified female breast: Principal | ICD-10-CM

## 2023-12-07 DIAGNOSIS — Z8709 Personal history of other diseases of the respiratory system: Principal | ICD-10-CM

## 2023-12-08 DIAGNOSIS — C50211 Malignant neoplasm of upper-inner quadrant of right female breast: Principal | ICD-10-CM

## 2023-12-09 DIAGNOSIS — C50211 Malignant neoplasm of upper-inner quadrant of right female breast: Principal | ICD-10-CM

## 2023-12-26 DIAGNOSIS — C50211 Malignant neoplasm of upper-inner quadrant of right female breast: Principal | ICD-10-CM

## 2024-01-06 DIAGNOSIS — C50211 Malignant neoplasm of upper-inner quadrant of right female breast: Principal | ICD-10-CM

## 2024-01-07 DIAGNOSIS — C50211 Malignant neoplasm of upper-inner quadrant of right female breast: Principal | ICD-10-CM

## 2024-01-10 DIAGNOSIS — C50211 Malignant neoplasm of upper-inner quadrant of right female breast: Principal | ICD-10-CM

## 2024-01-11 ENCOUNTER — Encounter: Admit: 2024-01-11 | Discharge: 2024-01-11 | Payer: MEDICARE

## 2024-01-11 ENCOUNTER — Ambulatory Visit
Admit: 2024-01-11 | Discharge: 2024-01-11 | Payer: MEDICARE | Attending: Hematology & Oncology | Primary: Hematology & Oncology

## 2024-01-11 ENCOUNTER — Ambulatory Visit: Admit: 2024-01-11 | Discharge: 2024-01-11 | Payer: MEDICARE

## 2024-01-11 ENCOUNTER — Encounter
Admit: 2024-01-11 | Discharge: 2024-01-11 | Payer: MEDICARE | Attending: Hematology & Oncology | Primary: Hematology & Oncology

## 2024-01-11 DIAGNOSIS — Z79818 Long term (current) use of other agents affecting estrogen receptors and estrogen levels: Principal | ICD-10-CM

## 2024-01-11 DIAGNOSIS — C50211 Malignant neoplasm of upper-inner quadrant of right female breast: Principal | ICD-10-CM

## 2024-01-11 DIAGNOSIS — C50919 Malignant neoplasm of unspecified site of unspecified female breast: Principal | ICD-10-CM

## 2024-01-11 DIAGNOSIS — C7800 Secondary malignant neoplasm of unspecified lung: Principal | ICD-10-CM

## 2024-01-11 DIAGNOSIS — C50911 Malignant neoplasm of unspecified site of right female breast: Principal | ICD-10-CM

## 2024-01-11 DIAGNOSIS — L989 Disorder of the skin and subcutaneous tissue, unspecified: Principal | ICD-10-CM

## 2024-01-11 MED ORDER — MUPIROCIN 2 % TOPICAL OINTMENT
Freq: Three times a day (TID) | TOPICAL | 0 refills | 7.00000 days | Status: CP
Start: 2024-01-11 — End: 2024-01-18

## 2024-01-14 DIAGNOSIS — C50211 Malignant neoplasm of upper-inner quadrant of right female breast: Principal | ICD-10-CM

## 2024-01-23 DIAGNOSIS — C50211 Malignant neoplasm of upper-inner quadrant of right female breast: Principal | ICD-10-CM

## 2024-02-08 ENCOUNTER — Encounter: Admit: 2024-02-08 | Discharge: 2024-02-08 | Payer: MEDICARE

## 2024-02-08 ENCOUNTER — Ambulatory Visit
Admit: 2024-02-08 | Discharge: 2024-02-08 | Payer: MEDICARE | Attending: Hematology & Oncology | Primary: Hematology & Oncology

## 2024-02-08 ENCOUNTER — Ambulatory Visit: Admit: 2024-02-08 | Discharge: 2024-02-08 | Payer: MEDICARE

## 2024-02-08 ENCOUNTER — Encounter
Admit: 2024-02-08 | Discharge: 2024-02-08 | Payer: MEDICARE | Attending: Hematology & Oncology | Primary: Hematology & Oncology

## 2024-02-08 DIAGNOSIS — C50211 Malignant neoplasm of upper-inner quadrant of right female breast: Principal | ICD-10-CM

## 2024-02-08 DIAGNOSIS — E538 Deficiency of other specified B group vitamins: Principal | ICD-10-CM

## 2024-02-09 DIAGNOSIS — C50211 Malignant neoplasm of upper-inner quadrant of right female breast: Principal | ICD-10-CM

## 2024-02-13 DIAGNOSIS — C50211 Malignant neoplasm of upper-inner quadrant of right female breast: Principal | ICD-10-CM

## 2024-02-20 DIAGNOSIS — C50211 Malignant neoplasm of upper-inner quadrant of right female breast: Principal | ICD-10-CM

## 2024-03-02 DIAGNOSIS — C50211 Malignant neoplasm of upper-inner quadrant of right female breast: Principal | ICD-10-CM

## 2024-03-09 ENCOUNTER — Encounter: Admit: 2024-03-09 | Discharge: 2024-03-09 | Payer: BLUE CROSS/BLUE SHIELD

## 2024-03-09 ENCOUNTER — Encounter
Admit: 2024-03-09 | Discharge: 2024-03-09 | Payer: BLUE CROSS/BLUE SHIELD | Attending: Hematology & Oncology | Primary: Hematology & Oncology

## 2024-03-09 ENCOUNTER — Ambulatory Visit: Admit: 2024-03-09 | Discharge: 2024-03-09 | Payer: BLUE CROSS/BLUE SHIELD

## 2024-03-09 ENCOUNTER — Ambulatory Visit
Admit: 2024-03-09 | Discharge: 2024-03-09 | Payer: BLUE CROSS/BLUE SHIELD | Attending: Hematology & Oncology | Primary: Hematology & Oncology

## 2024-03-09 DIAGNOSIS — Z79818 Long term (current) use of other agents affecting estrogen receptors and estrogen levels: Principal | ICD-10-CM

## 2024-03-09 DIAGNOSIS — C50911 Malignant neoplasm of unspecified site of right female breast: Principal | ICD-10-CM

## 2024-03-09 DIAGNOSIS — C50211 Malignant neoplasm of upper-inner quadrant of right female breast: Principal | ICD-10-CM

## 2024-03-09 DIAGNOSIS — C50919 Malignant neoplasm of unspecified site of unspecified female breast: Principal | ICD-10-CM

## 2024-03-09 DIAGNOSIS — J90 Pleural effusion, not elsewhere classified: Principal | ICD-10-CM

## 2024-03-09 DIAGNOSIS — C7800 Secondary malignant neoplasm of unspecified lung: Principal | ICD-10-CM

## 2024-03-10 DIAGNOSIS — C50211 Malignant neoplasm of upper-inner quadrant of right female breast: Principal | ICD-10-CM

## 2024-03-19 DIAGNOSIS — C50211 Malignant neoplasm of upper-inner quadrant of right female breast: Principal | ICD-10-CM
